# Patient Record
Sex: Female | Born: 1960 | Hispanic: No | Marital: Married | Smoking: Never smoker
Health system: Southern US, Community
[De-identification: ages and names within clinical notes are randomized; demographics above are authoritative.]

## PROBLEM LIST (undated history)

## (undated) DIAGNOSIS — E119 Type 2 diabetes mellitus without complications: Secondary | ICD-10-CM

## (undated) DIAGNOSIS — N289 Disorder of kidney and ureter, unspecified: Secondary | ICD-10-CM

## (undated) DIAGNOSIS — I10 Essential (primary) hypertension: Secondary | ICD-10-CM

---

## 2019-07-13 ENCOUNTER — Other Ambulatory Visit: Payer: Self-pay

## 2019-07-13 ENCOUNTER — Emergency Department (HOSPITAL_BASED_OUTPATIENT_CLINIC_OR_DEPARTMENT_OTHER): Payer: No Typology Code available for payment source

## 2019-07-13 ENCOUNTER — Inpatient Hospital Stay (HOSPITAL_BASED_OUTPATIENT_CLINIC_OR_DEPARTMENT_OTHER)
Admission: RE | Admit: 2019-07-13 | Discharge: 2019-07-25 | DRG: 682 | Disposition: A | Discharge status: Home Health | Payer: No Typology Code available for payment source | Attending: Internal Medicine | Admitting: Internal Medicine

## 2019-07-13 ENCOUNTER — Encounter (HOSPITAL_BASED_OUTPATIENT_CLINIC_OR_DEPARTMENT_OTHER): Payer: Self-pay | Admitting: Emergency Medicine

## 2019-07-13 DIAGNOSIS — D72829 Elevated white blood cell count, unspecified: Secondary | ICD-10-CM | POA: Diagnosis present

## 2019-07-13 DIAGNOSIS — R636 Underweight: Secondary | ICD-10-CM | POA: Diagnosis present

## 2019-07-13 DIAGNOSIS — Z20822 Contact with and (suspected) exposure to covid-19: Secondary | ICD-10-CM | POA: Diagnosis present

## 2019-07-13 DIAGNOSIS — K5900 Constipation, unspecified: Secondary | ICD-10-CM | POA: Diagnosis not present

## 2019-07-13 DIAGNOSIS — R8271 Bacteriuria: Secondary | ICD-10-CM | POA: Diagnosis not present

## 2019-07-13 DIAGNOSIS — E878 Other disorders of electrolyte and fluid balance, not elsewhere classified: Secondary | ICD-10-CM | POA: Diagnosis present

## 2019-07-13 DIAGNOSIS — R451 Restlessness and agitation: Secondary | ICD-10-CM | POA: Diagnosis not present

## 2019-07-13 DIAGNOSIS — D631 Anemia in chronic kidney disease: Secondary | ICD-10-CM | POA: Diagnosis present

## 2019-07-13 DIAGNOSIS — E875 Hyperkalemia: Secondary | ICD-10-CM | POA: Diagnosis not present

## 2019-07-13 DIAGNOSIS — I10 Essential (primary) hypertension: Secondary | ICD-10-CM | POA: Diagnosis present

## 2019-07-13 DIAGNOSIS — Z794 Long term (current) use of insulin: Secondary | ICD-10-CM

## 2019-07-13 DIAGNOSIS — Z7189 Other specified counseling: Secondary | ICD-10-CM

## 2019-07-13 DIAGNOSIS — E872 Acidosis: Secondary | ICD-10-CM | POA: Diagnosis not present

## 2019-07-13 DIAGNOSIS — D509 Iron deficiency anemia, unspecified: Secondary | ICD-10-CM | POA: Diagnosis present

## 2019-07-13 DIAGNOSIS — E871 Hypo-osmolality and hyponatremia: Secondary | ICD-10-CM | POA: Diagnosis present

## 2019-07-13 DIAGNOSIS — Z7902 Long term (current) use of antithrombotics/antiplatelets: Secondary | ICD-10-CM

## 2019-07-13 DIAGNOSIS — E1122 Type 2 diabetes mellitus with diabetic chronic kidney disease: Secondary | ICD-10-CM | POA: Diagnosis present

## 2019-07-13 DIAGNOSIS — D696 Thrombocytopenia, unspecified: Secondary | ICD-10-CM | POA: Diagnosis present

## 2019-07-13 DIAGNOSIS — Z992 Dependence on renal dialysis: Secondary | ICD-10-CM

## 2019-07-13 DIAGNOSIS — E861 Hypovolemia: Secondary | ICD-10-CM | POA: Diagnosis present

## 2019-07-13 DIAGNOSIS — N186 End stage renal disease: Secondary | ICD-10-CM | POA: Diagnosis present

## 2019-07-13 DIAGNOSIS — Z7982 Long term (current) use of aspirin: Secondary | ICD-10-CM

## 2019-07-13 DIAGNOSIS — N184 Chronic kidney disease, stage 4 (severe): Secondary | ICD-10-CM | POA: Diagnosis not present

## 2019-07-13 DIAGNOSIS — E119 Type 2 diabetes mellitus without complications: Secondary | ICD-10-CM

## 2019-07-13 DIAGNOSIS — N185 Chronic kidney disease, stage 5: Secondary | ICD-10-CM | POA: Diagnosis present

## 2019-07-13 DIAGNOSIS — B181 Chronic viral hepatitis B without delta-agent: Secondary | ICD-10-CM | POA: Diagnosis present

## 2019-07-13 DIAGNOSIS — I120 Hypertensive chronic kidney disease with stage 5 chronic kidney disease or end stage renal disease: Principal | ICD-10-CM | POA: Diagnosis present

## 2019-07-13 DIAGNOSIS — D6959 Other secondary thrombocytopenia: Secondary | ICD-10-CM | POA: Diagnosis present

## 2019-07-13 DIAGNOSIS — R54 Age-related physical debility: Secondary | ICD-10-CM | POA: Diagnosis present

## 2019-07-13 DIAGNOSIS — E11649 Type 2 diabetes mellitus with hypoglycemia without coma: Secondary | ICD-10-CM | POA: Diagnosis not present

## 2019-07-13 DIAGNOSIS — G9341 Metabolic encephalopathy: Secondary | ICD-10-CM | POA: Diagnosis present

## 2019-07-13 DIAGNOSIS — E876 Hypokalemia: Secondary | ICD-10-CM | POA: Diagnosis present

## 2019-07-13 DIAGNOSIS — Z905 Acquired absence of kidney: Secondary | ICD-10-CM

## 2019-07-13 DIAGNOSIS — Z681 Body mass index (BMI) 19 or less, adult: Secondary | ICD-10-CM

## 2019-07-13 DIAGNOSIS — M109 Gout, unspecified: Secondary | ICD-10-CM | POA: Diagnosis present

## 2019-07-13 DIAGNOSIS — Z79899 Other long term (current) drug therapy: Secondary | ICD-10-CM

## 2019-07-13 DIAGNOSIS — Z515 Encounter for palliative care: Secondary | ICD-10-CM | POA: Diagnosis not present

## 2019-07-13 HISTORY — DX: Type 2 diabetes mellitus without complications: E11.9

## 2019-07-13 HISTORY — DX: Disorder of kidney and ureter, unspecified: N28.9

## 2019-07-13 HISTORY — DX: Essential (primary) hypertension: I10

## 2019-07-13 LAB — CBC WITH DIFFERENTIAL/PLATELET
Abs Immature Granulocytes: 0.16 10*3/uL — ABNORMAL HIGH (ref 0.00–0.07)
Basophils Absolute: 0 10*3/uL (ref 0.0–0.1)
Basophils Relative: 0 %
Eosinophils Absolute: 0 10*3/uL (ref 0.0–0.5)
Eosinophils Relative: 0 %
HCT: 38.5 % (ref 36.0–46.0)
Hemoglobin: 12.5 g/dL (ref 12.0–15.0)
Immature Granulocytes: 1 %
Lymphocytes Relative: 6 %
Lymphs Abs: 0.8 10*3/uL (ref 0.7–4.0)
MCH: 19.6 pg — ABNORMAL LOW (ref 26.0–34.0)
MCHC: 32.5 g/dL (ref 30.0–36.0)
MCV: 60.4 fL — ABNORMAL LOW (ref 80.0–100.0)
Monocytes Absolute: 1.1 10*3/uL — ABNORMAL HIGH (ref 0.1–1.0)
Monocytes Relative: 8 %
Neutro Abs: 10.8 10*3/uL — ABNORMAL HIGH (ref 1.7–7.7)
Neutrophils Relative %: 85 %
Platelets: 108 10*3/uL — ABNORMAL LOW (ref 150–400)
RBC: 6.37 MIL/uL — ABNORMAL HIGH (ref 3.87–5.11)
RDW: 17.2 % — ABNORMAL HIGH (ref 11.5–15.5)
WBC: 12.9 10*3/uL — ABNORMAL HIGH (ref 4.0–10.5)
nRBC: 0.9 % — ABNORMAL HIGH (ref 0.0–0.2)

## 2019-07-13 LAB — URINALYSIS, ROUTINE W REFLEX MICROSCOPIC
Bilirubin Urine: NEGATIVE
Glucose, UA: 100 mg/dL — AB
Hgb urine dipstick: NEGATIVE
Ketones, ur: NEGATIVE mg/dL
Nitrite: NEGATIVE
Protein, ur: 100 mg/dL — AB
Specific Gravity, Urine: 1.01 (ref 1.005–1.030)
pH: 7 (ref 5.0–8.0)

## 2019-07-13 LAB — CBG MONITORING, ED: Glucose-Capillary: 96 mg/dL (ref 70–99)

## 2019-07-13 LAB — COMPREHENSIVE METABOLIC PANEL
ALT: 29 U/L (ref 0–44)
AST: 37 U/L (ref 15–41)
Albumin: 3.7 g/dL (ref 3.5–5.0)
Alkaline Phosphatase: 73 U/L (ref 38–126)
Anion gap: 15 (ref 5–15)
BUN: 51 mg/dL — ABNORMAL HIGH (ref 6–20)
CO2: 26 mmol/L (ref 22–32)
Calcium: 9.3 mg/dL (ref 8.9–10.3)
Chloride: 65 mmol/L — ABNORMAL LOW (ref 98–111)
Creatinine, Ser: 6.73 mg/dL — ABNORMAL HIGH (ref 0.44–1.00)
GFR calc Af Amer: 7 mL/min — ABNORMAL LOW (ref >60)
GFR calc non Af Amer: 6 mL/min — ABNORMAL LOW (ref >60)
Glucose, Bld: 123 mg/dL — ABNORMAL HIGH (ref 70–99)
Potassium: 3.8 mmol/L (ref 3.5–5.1)
Sodium: 106 mmol/L — CL (ref 135–145)
Total Bilirubin: 1.5 mg/dL — ABNORMAL HIGH (ref 0.3–1.2)
Total Protein: 7 g/dL (ref 6.5–8.1)

## 2019-07-13 LAB — LIPASE, BLOOD: Lipase: 47 U/L (ref 11–51)

## 2019-07-13 LAB — URINALYSIS, MICROSCOPIC (REFLEX)

## 2019-07-13 LAB — TROPONIN I (HIGH SENSITIVITY): Troponin I (High Sensitivity): 13 ng/L (ref <18)

## 2019-07-13 MED ORDER — SODIUM CHLORIDE 0.9 % IV SOLN: Freq: Once | INTRAVENOUS | Status: AC

## 2019-07-13 MED ORDER — SODIUM CHLORIDE 0.9 % IV BOLUS
500.0000 mL | Freq: Once | INTRAVENOUS | Status: AC
  Administered 2019-07-13: 500 mL via INTRAVENOUS

## 2019-07-13 NOTE — ED Notes (Signed)
Crystal Jacobs, [place] and primary RN  aware that sodium level is 106.

## 2019-07-13 NOTE — ED Notes (Signed)
ED Provider at bedside. 

## 2019-07-13 NOTE — ED Notes (Signed)
Pt on monitor 

## 2019-07-13 NOTE — ED Triage Notes (Signed)
Generalized weakness x 2 weeks. Was constipated and took a laxative, now has diarrhea and vomiting.

## 2019-07-13 NOTE — ED Notes (Signed)
Portable Xray at bedside.

## 2019-07-13 NOTE — ED Notes (Signed)
[facility] notified (Crystal Jacobs) - patient ready for transport 

## 2019-07-13 NOTE — ED Provider Notes (Signed)
[facility] EMERGENCY DEPARTMENT Provider Note   CSN: [id number] Arrival date & time: 07/13/19  1826     History Chief Complaint  Patient presents with  . Weakness    Crystal Jacobs is a 59 y.o. female.  HPI   Pt is a 59 y/o female with a h/o CKD, hypertension, diabetes who presents to the ED today for eval of generalized weakness.  Daughter at bedside assist with the history and acts as a Optometrist.  Patient declines formal translator.  Daughter states the patient was constipated about 4 to 5 days ago.  They gave her a dose of Dulcolax and the following day she had 9 bowel movements.  Since then she has been very weak. She has had normal PO intake since then. She normally does not eat much and has been c/o some vomiting. Denies seizures, but has been a little bit confused. Denies fever, URI sxs, dysuria, abd pain. Denies vomiting.  She is complaining of some chest pain but denies any shortness of breath or cough.  Patient is here visiting from [place] and got here 1 month ago.  She follows with nephrology back in [place].  She is not currently on dialysis.  She has been on a low-sodium diet.  Daughter has patient's prior labs which showed a baseline creatinine of around 7 from April.  Patient was also seen in urgent care prior to arrival and had labs completed which showed a sodium of 117 and a potassium of 3.3.  She was sent here for further evaluation.  Past Medical History:  Diagnosis Date  . Diabetes mellitus without complication ([place])   . Hypertension   . Renal disorder     Patient Active Problem List   Diagnosis Date Noted  . Acute renal failure (ARF) ([place]) 07/14/2019  . Microcytic anemia 07/14/2019  . Thrombocytopenia ([place]) 07/14/2019  . Hyponatremia 07/13/2019    History reviewed. No pertinent surgical history.   OB History   No obstetric history on file.     No family history on file.  Social History   Tobacco Use  . Smoking status: Never Smoker   . Smokeless tobacco: Never Used  Substance Use Topics  . Alcohol use: Not Currently  . Drug use: Not on file    Home Medications Prior to Admission medications   Not on File    Allergies    Patient has no known allergies.  Review of Systems   Review of Systems  Constitutional: Negative for fever.  HENT: Negative for ear pain and sore throat.   Eyes: Negative for visual disturbance.  Respiratory: Negative for cough and shortness of breath.   Cardiovascular: Negative for chest pain.  Gastrointestinal: Positive for constipation, diarrhea and nausea. Negative for abdominal pain and vomiting.  Genitourinary: Negative for dysuria and hematuria.  Musculoskeletal: Negative for back pain.  Skin: Negative for rash.  Neurological: Positive for weakness. Negative for seizures and syncope.  All other systems reviewed and are negative.   Physical Exam Updated Vital Signs BP (!) 156/60   Pulse 64   Temp 97.7 F (36.5 C) (Oral)   Resp 11   Ht 5\' 2"  (1.575 m)   Wt 43.1 kg   SpO2 100%   BMI 17.38 kg/m   Physical Exam Vitals and nursing note reviewed.  Constitutional:      General: She is not in acute distress.    Appearance: She is well-developed.     Comments: Frail, chronically ill appearing woman  HENT:  Head: Normocephalic and atraumatic.     Mouth/Throat:     Mouth: Mucous membranes are dry.  Eyes:     Conjunctiva/sclera: Conjunctivae normal.  Cardiovascular:     Rate and Rhythm: Normal rate and regular rhythm.     Heart sounds: Normal heart sounds. No murmur heard.   Pulmonary:     Effort: Pulmonary effort is normal. No respiratory distress.     Breath sounds: Normal breath sounds. No wheezing, rhonchi or rales.  Abdominal:     General: Bowel sounds are normal.     Palpations: Abdomen is soft.     Tenderness: There is no abdominal tenderness. There is no guarding or rebound.  Musculoskeletal:     Cervical back: Neck supple.     Comments: ttp to the right  mid back  Skin:    General: Skin is warm and dry.  Neurological:     Mental Status: She is alert.     ED Results / Procedures / Treatments   Labs (all labs ordered are listed, but only abnormal results are displayed) Labs Reviewed  CBC WITH DIFFERENTIAL/PLATELET - Abnormal; Notable for the following components:      Result Value   WBC 12.9 (*)    RBC 6.37 (*)    MCV 60.4 (*)    MCH 19.6 (*)    RDW 17.2 (*)    Platelets 108 (*)    nRBC 0.9 (*)    Neutro Abs 10.8 (*)    Monocytes Absolute 1.1 (*)    Abs Immature Granulocytes 0.16 (*)    All other components within normal limits  URINALYSIS, ROUTINE W REFLEX MICROSCOPIC - Abnormal; Notable for the following components:   Glucose, UA 100 (*)    Protein, ur 100 (*)    Leukocytes,Ua TRACE (*)    All other components within normal limits  COMPREHENSIVE METABOLIC PANEL - Abnormal; Notable for the following components:   Sodium 106 (*)    Chloride 65 (*)    Glucose, Bld 123 (*)    BUN 51 (*)    Creatinine, Ser 6.73 (*)    Total Bilirubin 1.5 (*)    GFR calc non Af Amer 6 (*)    GFR calc Af Amer 7 (*)    All other components within normal limits  URINALYSIS, MICROSCOPIC (REFLEX) - Abnormal; Notable for the following components:   Bacteria, UA MANY (*)    Non Squamous Epithelial PRESENT (*)    All other components within normal limits  SARS CORONAVIRUS 2 BY RT PCR (HOSPITAL ORDER, [facility])  MRSA PCR SCREENING  LIPASE, BLOOD  SODIUM, URINE, RANDOM  OSMOLALITY, URINE  OSMOLALITY  TSH  BASIC METABOLIC PANEL  BASIC METABOLIC PANEL  BASIC METABOLIC PANEL  BASIC METABOLIC PANEL  CBG MONITORING, ED  TROPONIN I (HIGH SENSITIVITY)  TROPONIN I (HIGH SENSITIVITY)    EKG EKG Interpretation  Date/Time:  Saturday July 13 2019 20:55:32 EDT Ventricular Rate:  68 PR Interval:    QRS Duration: 88 QT Interval:  407 QTC Calculation: 433 R Axis:   42 Text Interpretation: Sinus rhythm Borderline  repolarization abnormality Baseline wander in lead(s) V6 No old tracing to compare Confirmed by Aletta Edouard [phone]) on 07/13/2019 9:06:18 PM   Radiology DG Chest Portable 1 View  Result Date: 07/13/2019 CLINICAL DATA:  59 year old female with chest pain. EXAM: PORTABLE CHEST 1 VIEW COMPARISON:  None. FINDINGS: The heart size and mediastinal contours are within normal limits. Both lungs are  clear. The visualized skeletal structures are unremarkable. IMPRESSION: No active disease. Electronically Signed   By: Anner Crete M.D.   On: 07/13/2019 20:38    Procedures Procedures (including critical care time)  CRITICAL CARE Performed by: Rodney Booze   Total critical care time: 40 minutes  Critical care time was exclusive of separately billable procedures and treating other patients.  Critical care was necessary to treat or prevent imminent or life-threatening deterioration.  Critical care was time spent personally by me on the following activities: development of treatment plan with patient and/or surrogate as well as nursing, discussions with consultants, evaluation of patient's response to treatment, examination of patient, obtaining history from patient or surrogate, ordering and performing treatments and interventions, ordering and review of laboratory studies, ordering and review of radiographic studies, pulse oximetry and re-evaluation of patient's condition.   Medications Ordered in ED Medications  sodium chloride 0.9 % bolus 500 mL (500 mLs Intravenous New Bag/Given 07/13/19 2319)  0.9 %  sodium chloride infusion ( Intravenous New Bag/Given 07/13/19 2318)    ED Course  I have reviewed the triage vital signs and the nursing notes.  Pertinent labs & imaging results that were available during my care of the patient were reviewed by me and considered in my medical decision making (see chart for details).  Clinical Course as of Jul 14 43  Sat Jul 13, 1826  6171  59 year old female visiting here with increased weakness and recent constipation diarrhea with laxative.  Seen at urgent care and found to have a low sodium.  Chronic CKD not on dialysis.  Getting repeat labs.  Disposition per results of testing.   [MB]    Clinical Course User Index [MB] Hayden Rasmussen, MD   MDM Rules/Calculators/A&P                          59 year old female with history of CKD presenting with generalized weakness after having multiple bowel movements after laxative earlier this week.  Also had labs at urgent care prior to arrival with sodium of 117.  CBC with leukocytosis at 12.9, thrombocytopenia present.  No anemia. CMP with sodium of 106, chloride low, elevated BUN/creatinine which appears to be around her baseline.  Bilirubin slightly elevated 1.5. Lipase negative  troponin negative Covid negative UA with some leuks but otherwise no findings to suggest UTI.  Patient with significant hyponatremia, will require admission.  Suspect that volume depletion is likely contributing given multiple episodes of diarrhea earlier this week.  Will consult hospitalist service for admission.  10:12 PM CONSULT with Dr. Claria Dice who accepts patient for admission. Advises to order urine osmolality, serum osmolality and urine sodium. Advises to consult nephrology.   10:17 PM CONSULT with Dr. Jonnie Finner with nephrology who recommended giving the patient a 500 cc bolus of fluids.  Recommended starting maintenance fluids at 75 cc/h and checking sodium every 3 hours.  If sodium is uptrending then continue treatment however if sodium is downtrending then reconsult nephrology for further recommendations.  Patient transferred [facility] in stable condition.   Final Clinical Impression(s) / ED Diagnoses Final diagnoses:  Hyponatremia    Rx / DC Orders ED Discharge Orders    None       Rodney Booze, PA-C 07/14/19 0045    Hayden Rasmussen, MD 07/14/19 1114

## 2019-07-13 NOTE — ED Notes (Signed)
EKG done by someone else, clicked off order

## 2019-07-14 ENCOUNTER — Encounter (HOSPITAL_COMMUNITY): Payer: Self-pay | Admitting: Internal Medicine

## 2019-07-14 DIAGNOSIS — Z681 Body mass index (BMI) 19 or less, adult: Secondary | ICD-10-CM | POA: Diagnosis not present

## 2019-07-14 DIAGNOSIS — E876 Hypokalemia: Secondary | ICD-10-CM | POA: Diagnosis present

## 2019-07-14 DIAGNOSIS — E871 Hypo-osmolality and hyponatremia: Secondary | ICD-10-CM | POA: Diagnosis present

## 2019-07-14 DIAGNOSIS — D6959 Other secondary thrombocytopenia: Secondary | ICD-10-CM | POA: Diagnosis present

## 2019-07-14 DIAGNOSIS — Z7189 Other specified counseling: Secondary | ICD-10-CM | POA: Diagnosis not present

## 2019-07-14 DIAGNOSIS — D631 Anemia in chronic kidney disease: Secondary | ICD-10-CM | POA: Diagnosis present

## 2019-07-14 DIAGNOSIS — Z20822 Contact with and (suspected) exposure to covid-19: Secondary | ICD-10-CM | POA: Diagnosis present

## 2019-07-14 DIAGNOSIS — N184 Chronic kidney disease, stage 4 (severe): Secondary | ICD-10-CM | POA: Diagnosis present

## 2019-07-14 DIAGNOSIS — E119 Type 2 diabetes mellitus without complications: Secondary | ICD-10-CM | POA: Diagnosis not present

## 2019-07-14 DIAGNOSIS — D509 Iron deficiency anemia, unspecified: Secondary | ICD-10-CM | POA: Diagnosis present

## 2019-07-14 DIAGNOSIS — B181 Chronic viral hepatitis B without delta-agent: Secondary | ICD-10-CM | POA: Diagnosis present

## 2019-07-14 DIAGNOSIS — E878 Other disorders of electrolyte and fluid balance, not elsewhere classified: Secondary | ICD-10-CM | POA: Diagnosis present

## 2019-07-14 DIAGNOSIS — G9341 Metabolic encephalopathy: Secondary | ICD-10-CM | POA: Diagnosis present

## 2019-07-14 DIAGNOSIS — E872 Acidosis: Secondary | ICD-10-CM | POA: Diagnosis not present

## 2019-07-14 DIAGNOSIS — I120 Hypertensive chronic kidney disease with stage 5 chronic kidney disease or end stage renal disease: Secondary | ICD-10-CM | POA: Diagnosis present

## 2019-07-14 DIAGNOSIS — E11649 Type 2 diabetes mellitus with hypoglycemia without coma: Secondary | ICD-10-CM | POA: Diagnosis not present

## 2019-07-14 DIAGNOSIS — D696 Thrombocytopenia, unspecified: Secondary | ICD-10-CM | POA: Diagnosis present

## 2019-07-14 DIAGNOSIS — Z515 Encounter for palliative care: Secondary | ICD-10-CM | POA: Diagnosis not present

## 2019-07-14 DIAGNOSIS — R54 Age-related physical debility: Secondary | ICD-10-CM | POA: Diagnosis present

## 2019-07-14 DIAGNOSIS — N185 Chronic kidney disease, stage 5: Secondary | ICD-10-CM

## 2019-07-14 DIAGNOSIS — R636 Underweight: Secondary | ICD-10-CM | POA: Diagnosis present

## 2019-07-14 DIAGNOSIS — I10 Essential (primary) hypertension: Secondary | ICD-10-CM | POA: Diagnosis not present

## 2019-07-14 DIAGNOSIS — E1122 Type 2 diabetes mellitus with diabetic chronic kidney disease: Secondary | ICD-10-CM | POA: Diagnosis present

## 2019-07-14 DIAGNOSIS — Z794 Long term (current) use of insulin: Secondary | ICD-10-CM

## 2019-07-14 DIAGNOSIS — N186 End stage renal disease: Secondary | ICD-10-CM | POA: Diagnosis present

## 2019-07-14 LAB — CBC
HCT: 37 % (ref 36.0–46.0)
Hemoglobin: 12.2 g/dL (ref 12.0–15.0)
MCH: 19.7 pg — ABNORMAL LOW (ref 26.0–34.0)
MCHC: 33 g/dL (ref 30.0–36.0)
MCV: 59.7 fL — ABNORMAL LOW (ref 80.0–100.0)
Platelets: 95 10*3/uL — ABNORMAL LOW (ref 150–400)
RBC: 6.2 MIL/uL — ABNORMAL HIGH (ref 3.87–5.11)
RDW: 15.9 % — ABNORMAL HIGH (ref 11.5–15.5)
WBC: 12 10*3/uL — ABNORMAL HIGH (ref 4.0–10.5)
nRBC: 0.6 % — ABNORMAL HIGH (ref 0.0–0.2)

## 2019-07-14 LAB — OSMOLALITY: Osmolality: 239 mOsm/kg — CL (ref 275–295)

## 2019-07-14 LAB — VITAMIN B12: Vitamin B-12: 3305 pg/mL — ABNORMAL HIGH (ref 180–914)

## 2019-07-14 LAB — HEMOGLOBIN A1C
Hgb A1c MFr Bld: 6.2 % — ABNORMAL HIGH (ref 4.8–5.6)
Mean Plasma Glucose: 131.24 mg/dL

## 2019-07-14 LAB — RENAL FUNCTION PANEL
Albumin: 2.6 g/dL — ABNORMAL LOW (ref 3.5–5.0)
Albumin: 2.4 g/dL — ABNORMAL LOW (ref 3.5–5.0)
Anion gap: 13 (ref 5–15)
Anion gap: 11 (ref 5–15)
BUN: 46 mg/dL — ABNORMAL HIGH (ref 6–20)
BUN: 42 mg/dL — ABNORMAL HIGH (ref 6–20)
CO2: 23 mmol/L (ref 22–32)
CO2: 19 mmol/L — ABNORMAL LOW (ref 22–32)
Calcium: 8.4 mg/dL — ABNORMAL LOW (ref 8.9–10.3)
Calcium: 7.4 mg/dL — ABNORMAL LOW (ref 8.9–10.3)
Chloride: 75 mmol/L — ABNORMAL LOW (ref 98–111)
Chloride: 87 mmol/L — ABNORMAL LOW (ref 98–111)
Creatinine, Ser: 6.52 mg/dL — ABNORMAL HIGH (ref 0.44–1.00)
Creatinine, Ser: 5.49 mg/dL — ABNORMAL HIGH (ref 0.44–1.00)
GFR calc Af Amer: 8 mL/min — ABNORMAL LOW (ref >60)
GFR calc Af Amer: 9 mL/min — ABNORMAL LOW (ref >60)
GFR calc non Af Amer: 7 mL/min — ABNORMAL LOW (ref >60)
GFR calc non Af Amer: 8 mL/min — ABNORMAL LOW (ref >60)
Glucose, Bld: 80 mg/dL (ref 70–99)
Glucose, Bld: 224 mg/dL — ABNORMAL HIGH (ref 70–99)
Phosphorus: 4.5 mg/dL (ref 2.5–4.6)
Phosphorus: 3.3 mg/dL (ref 2.5–4.6)
Potassium: 3.6 mmol/L (ref 3.5–5.1)
Potassium: 3.1 mmol/L — ABNORMAL LOW (ref 3.5–5.1)
Sodium: 111 mmol/L — CL (ref 135–145)
Sodium: 117 mmol/L — CL (ref 135–145)

## 2019-07-14 LAB — BASIC METABOLIC PANEL
Anion gap: 18 — ABNORMAL HIGH (ref 5–15)
BUN: 48 mg/dL — ABNORMAL HIGH (ref 6–20)
CO2: 21 mmol/L — ABNORMAL LOW (ref 22–32)
Calcium: 9.3 mg/dL (ref 8.9–10.3)
Chloride: 70 mmol/L — ABNORMAL LOW (ref 98–111)
Creatinine, Ser: 6.57 mg/dL — ABNORMAL HIGH (ref 0.44–1.00)
GFR calc Af Amer: 7 mL/min — ABNORMAL LOW (ref >60)
GFR calc non Af Amer: 6 mL/min — ABNORMAL LOW (ref >60)
Glucose, Bld: 78 mg/dL (ref 70–99)
Potassium: 3.8 mmol/L (ref 3.5–5.1)
Sodium: 109 mmol/L — CL (ref 135–145)

## 2019-07-14 LAB — FOLATE: Folate: >100 ng/mL (ref >5.9)

## 2019-07-14 LAB — GLUCOSE, CAPILLARY
Glucose-Capillary: 71 mg/dL (ref 70–99)
Glucose-Capillary: 169 mg/dL — ABNORMAL HIGH (ref 70–99)
Glucose-Capillary: 203 mg/dL — ABNORMAL HIGH (ref 70–99)
Glucose-Capillary: 191 mg/dL — ABNORMAL HIGH (ref 70–99)
Glucose-Capillary: 108 mg/dL — ABNORMAL HIGH (ref 70–99)

## 2019-07-14 LAB — SARS CORONAVIRUS 2 BY RT PCR (HOSPITAL ORDER, PERFORMED IN ~~LOC~~ HOSPITAL LAB): SARS Coronavirus 2: NEGATIVE

## 2019-07-14 LAB — SODIUM: Sodium: 113 mmol/L — CL (ref 135–145)

## 2019-07-14 LAB — FERRITIN: Ferritin: 1772 ng/mL — ABNORMAL HIGH (ref 11–307)

## 2019-07-14 LAB — TSH: TSH: 1.467 u[IU]/mL (ref 0.350–4.500)

## 2019-07-14 LAB — IRON AND TIBC
Iron: 97 ug/dL (ref 28–170)
Saturation Ratios: 65 % — ABNORMAL HIGH (ref 10.4–31.8)
TIBC: 150 ug/dL — ABNORMAL LOW (ref 250–450)
UIBC: 53 ug/dL

## 2019-07-14 LAB — HIV ANTIBODY (ROUTINE TESTING W REFLEX): HIV Screen 4th Generation wRfx: NONREACTIVE

## 2019-07-14 LAB — SODIUM, URINE, RANDOM: Sodium, Ur: 11 mmol/L

## 2019-07-14 LAB — OSMOLALITY, URINE: Osmolality, Ur: 173 mOsm/kg — ABNORMAL LOW (ref 300–900)

## 2019-07-14 LAB — TROPONIN I (HIGH SENSITIVITY): Troponin I (High Sensitivity): 10 ng/L (ref <18)

## 2019-07-14 LAB — MRSA PCR SCREENING: MRSA by PCR: NEGATIVE

## 2019-07-14 MED ORDER — ONDANSETRON HCL 4 MG/2ML IJ SOLN: 4.0000 mg | Freq: Four times a day (QID) | INTRAMUSCULAR | Status: DC | PRN

## 2019-07-14 MED ORDER — ACETAMINOPHEN 325 MG PO TABS: 650.0000 mg | ORAL_TABLET | Freq: Four times a day (QID) | ORAL | Status: DC | PRN

## 2019-07-14 MED ORDER — SENNOSIDES-DOCUSATE SODIUM 8.6-50 MG PO TABS: 1.0000 | ORAL_TABLET | Freq: Every evening | ORAL | Status: DC | PRN

## 2019-07-14 MED ORDER — DEXTROSE 5 % IV SOLN: INTRAVENOUS | Status: DC

## 2019-07-14 MED ORDER — VITAMIN D 25 MCG (1000 UNIT) PO TABS
1000.0000 [IU] | ORAL_TABLET | Freq: Every day | ORAL | Status: DC
  Administered 2019-07-14 – 2019-07-25 (×10): 1000 [IU] via ORAL
  Filled 2019-07-14 (×11): qty 1

## 2019-07-14 MED ORDER — SODIUM CHLORIDE 0.9 % IV SOLN: INTRAVENOUS | Status: DC

## 2019-07-14 MED ORDER — CLOPIDOGREL BISULFATE 75 MG PO TABS
75.0000 mg | ORAL_TABLET | Freq: Every day | ORAL | Status: DC
  Administered 2019-07-14 – 2019-07-25 (×12): 75 mg via ORAL
  Filled 2019-07-14 (×12): qty 1

## 2019-07-14 MED ORDER — INSULIN ASPART 100 UNIT/ML ~~LOC~~ SOLN
2.0000 [IU] | Freq: Three times a day (TID) | SUBCUTANEOUS | Status: DC
  Administered 2019-07-14 – 2019-07-23 (×13): 2 [IU] via SUBCUTANEOUS

## 2019-07-14 MED ORDER — ASPIRIN EC 81 MG PO TBEC
81.0000 mg | DELAYED_RELEASE_TABLET | Freq: Every day | ORAL | Status: DC
  Administered 2019-07-14 – 2019-07-25 (×11): 81 mg via ORAL
  Filled 2019-07-14 (×13): qty 1

## 2019-07-14 MED ORDER — INSULIN ASPART 100 UNIT/ML ~~LOC~~ SOLN: 0.0000 [IU] | Freq: Three times a day (TID) | SUBCUTANEOUS | Status: DC

## 2019-07-14 MED ORDER — HEPARIN SODIUM (PORCINE) 5000 UNIT/ML IJ SOLN
5000.0000 [IU] | Freq: Three times a day (TID) | INTRAMUSCULAR | Status: DC
  Administered 2019-07-14 – 2019-07-16 (×6): 5000 [IU] via SUBCUTANEOUS
  Filled 2019-07-14 (×8): qty 1

## 2019-07-14 MED ORDER — INSULIN DETEMIR 100 UNIT/ML ~~LOC~~ SOLN
2.0000 [IU] | Freq: Every day | SUBCUTANEOUS | Status: DC
  Administered 2019-07-14 – 2019-07-20 (×6): 2 [IU] via SUBCUTANEOUS
  Filled 2019-07-14 (×8): qty 0.02

## 2019-07-14 MED ORDER — SODIUM BICARBONATE 650 MG PO TABS
650.0000 mg | ORAL_TABLET | Freq: Two times a day (BID) | ORAL | Status: DC
  Administered 2019-07-14: 650 mg via ORAL
  Filled 2019-07-14: qty 1

## 2019-07-14 MED ORDER — INSULIN ASPART 100 UNIT/ML ~~LOC~~ SOLN
0.0000 [IU] | Freq: Three times a day (TID) | SUBCUTANEOUS | Status: DC
  Administered 2019-07-14: 2 [IU] via SUBCUTANEOUS
  Administered 2019-07-14 – 2019-07-21 (×7): 1 [IU] via SUBCUTANEOUS
  Administered 2019-07-21: 4 [IU] via SUBCUTANEOUS
  Administered 2019-07-22 (×2): 1 [IU] via SUBCUTANEOUS
  Administered 2019-07-23: 4 [IU] via SUBCUTANEOUS

## 2019-07-14 MED ORDER — DEXTROSE 5 % IV SOLN: INTRAVENOUS | Status: AC

## 2019-07-14 MED ORDER — ACETAMINOPHEN 650 MG RE SUPP
650.0000 mg | Freq: Four times a day (QID) | RECTAL | Status: DC | PRN
  Filled 2019-07-14: qty 1

## 2019-07-14 MED ORDER — FEBUXOSTAT 40 MG PO TABS
40.0000 mg | ORAL_TABLET | Freq: Every day | ORAL | Status: DC
  Administered 2019-07-14 – 2019-07-25 (×10): 40 mg via ORAL
  Filled 2019-07-14 (×12): qty 1

## 2019-07-14 MED ORDER — SODIUM CHLORIDE 0.9% FLUSH
3.0000 mL | Freq: Two times a day (BID) | INTRAVENOUS | Status: DC
  Administered 2019-07-14 – 2019-07-23 (×10): 3 mL via INTRAVENOUS

## 2019-07-14 MED ORDER — ONDANSETRON HCL 4 MG PO TABS: 4.0000 mg | ORAL_TABLET | Freq: Four times a day (QID) | ORAL | Status: DC | PRN

## 2019-07-14 MED ORDER — POTASSIUM CHLORIDE CRYS ER 20 MEQ PO TBCR
20.0000 meq | EXTENDED_RELEASE_TABLET | Freq: Two times a day (BID) | ORAL | Status: DC
  Administered 2019-07-14 – 2019-07-15 (×3): 20 meq via ORAL
  Filled 2019-07-14 (×3): qty 1

## 2019-07-14 MED ORDER — CLOPIDOGREL BISULFATE 75 MG PO TABS: 75.0000 mg | ORAL_TABLET | Freq: Every day | ORAL | Status: DC

## 2019-07-14 NOTE — Progress Notes (Signed)
PT Cancellation Note  Patient Details Name: Keyshia Orwick MRN: [id number] DOB: 27-Apr-1960   Cancelled Treatment:    Reason Eval/Treat Not Completed: Medical issues which prohibited therapy. Pt unable to tolerate vertical due to orthostatic hypotension. Will try again tomorrow.    Shary Decamp [facility] 07/14/2019, 2:35 PM Brix Brearley [place] Pager [phone] Office [phone]

## 2019-07-14 NOTE — Progress Notes (Signed)
Pt with T=95.5 rectally. Dr Claria Dice informed. Bair Hugger warming blanket applied. Rechecked T=97.5. Will continue to monitor pt.

## 2019-07-14 NOTE — Progress Notes (Addendum)
CRITICAL VALUE ALERT  Critical Value:  Na++ 117   Date & Time Notied:  07/14/2019 at 1630  Provider Notified: Charlynne Cousins, MD   Orders Received/Actions taken: known critical lab value, continue to monitor Pt's lab value - see orders

## 2019-07-14 NOTE — Progress Notes (Signed)
Could not do the 3 minute stand, Pt could not tolerate the standing b/p, said it became black during the middle of the b/p and had to sit back down.

## 2019-07-14 NOTE — Plan of Care (Signed)
°  Problem: Education: Goal: Knowledge of General Education information will improve Description: Including pain rating scale, medication(s)/side effects and non-pharmacologic comfort measures 07/14/2019 1039 by Shanon Ace, RN Outcome: Progressing 07/14/2019 1038 by Shanon Ace, RN Outcome: Progressing   Problem: Clinical Measurements: Goal: Ability to maintain clinical measurements within normal limits will improve 07/14/2019 1039 by Shanon Ace, RN Outcome: Progressing 07/14/2019 1038 by Shanon Ace, RN Outcome: Progressing Goal: Will remain free from infection 07/14/2019 1039 by Shanon Ace, RN Outcome: Progressing 07/14/2019 1038 by Shanon Ace, RN Outcome: Progressing Goal: Diagnostic test results will improve 07/14/2019 1039 by Shanon Ace, RN Outcome: Progressing 07/14/2019 1038 by Shanon Ace, RN Outcome: Progressing Goal: Respiratory complications will improve 07/14/2019 1039 by Shanon Ace, RN Outcome: Progressing 07/14/2019 1038 by Shanon Ace, RN Outcome: Progressing Goal: Cardiovascular complication will be avoided 07/14/2019 1039 by Shanon Ace, RN Outcome: Progressing 07/14/2019 1038 by Shanon Ace, RN Outcome: Progressing   Problem: Activity: Goal: Risk for activity intolerance will decrease 07/14/2019 1039 by Shanon Ace, RN Outcome: Progressing 07/14/2019 1038 by Shanon Ace, RN Outcome: Progressing   Problem: Nutrition: Goal: Adequate nutrition will be maintained 07/14/2019 1039 by Shanon Ace, RN Outcome: Progressing 07/14/2019 1038 by Shanon Ace, RN Outcome: Progressing   Problem: Coping: Goal: Level of anxiety will decrease 07/14/2019 1039 by Shanon Ace, RN Outcome: Progressing 07/14/2019 1038 by Shanon Ace, RN Outcome: Progressing   Problem: Elimination: Goal: Will not experience complications related to bowel motility 07/14/2019 1039 by Shanon Ace, RN Outcome: Progressing 07/14/2019 1038 by Shanon Ace,  RN Outcome: Progressing Goal: Will not experience complications related to urinary retention 07/14/2019 1039 by Shanon Ace, RN Outcome: Progressing 07/14/2019 1038 by Shanon Ace, RN Outcome: Progressing   Problem: Safety: Goal: Ability to remain free from injury will improve 07/14/2019 1039 by Shanon Ace, RN Outcome: Progressing 07/14/2019 1038 by Shanon Ace, RN Outcome: Progressing   Problem: Pain Managment: Goal: General experience of comfort will improve 07/14/2019 1039 by Shanon Ace, RN Outcome: Progressing 07/14/2019 1038 by Shanon Ace, RN Outcome: Progressing   Problem: Skin Integrity: Goal: Risk for impaired skin integrity will decrease 07/14/2019 1039 by Shanon Ace, RN Outcome: Progressing 07/14/2019 1038 by Shanon Ace, RN Outcome: Progressing

## 2019-07-14 NOTE — H&P (Signed)
History and Physical    Crystal Jacobs JIR:[id number] DOB: 07-07-1960 DOA: 07/13/2019  PCP: Patient, No Pcp Per  Patient coming from: [facility] ED  I have personally briefly reviewed patient's old medical records in [place]  Chief Complaint: Generalized weakness  HPI: Crystal Jacobs is a 59 y.o. female with reported medical history significant for insulin-dependent type 2 diabetes, hypertension, CKD stage V, reported history of nephrectomy who presented to [facility] ED for evaluation of generalized weakness.  History is supplemented by daughter at bedside.  About 2 weeks ago patient began to feel generally weak.  She had been having constipation and about 1 week ago she took Dulcolax.  The following day she had 9 bowel movements which have reportedly normalized since then.  She has however been feeling very weak and unable to perform the usual amount of activity she is used to.  She says she has had poor appetite and low oral intake.  She has had some nausea and vomiting.  She says she has a history of CKD stage V weight loss reported creatinine 7.7 in April 2021.  She says she is makes a small amount of urine without dysuria.  She says she usually gets injections to keep her blood counts up.  She denies any obvious bleeding.  She says she now again has decreased bowel movements than expected.  She is visiting from [place] and arrived 1 month ago.  She has been on a low-sodium diet.  She has a medication list which reads as follows:  Lantus 8 units once a day Novorapid 6 units daily Pantoprazole Multivitamin Minipress XL 5 mg twice daily Sodium bicarb 500 twice daily Febuxostat40 mg daily for gout Clopitab (Plavix/ASA 75/10) daily Cilnidipine 10/50 9 am and 9 pm   [facility] ED Course:  Initial vitals showed BP 124/50, pulse 60, RR 16, temp 97.7 Fahrenheit, SPO2 100% on room air.  Labs are significant for sodium 106, potassium 3.8, chloride  65, bicarb 26, BUN 51, creatinine 6.73, serum glucose 123, total bilirubin 1.5, AST 37, ALT 29, alk phos 73, WBC 12.9, hemoglobin 12.5, platelets 108,000, high-sensitivity troponin I 13 >> 10 lipase 47.  Urinalysis showed negative ketones, 100 protein, negative nitrites, trace leukocytes, 0-5 RBC/hpf, 0-5 WBC/hpf, many bacteria microscopy.  Urine sodium, urine osmolality, serum osmolality are ordered and pending.  TSH ordered and pending.  SARS-CoV-2 PCR is negative.  Portable chest x-ray was negative for focal consolidation, edema, or effusion.  EDP discussed the case with on-call nephrology who recommended 500 cc normal saline bolus followed by maintenance infusion at 75 mL/hour.  The hospitalist service was consulted to admit for further evaluation and management.  Review of Systems: All systems reviewed and are negative except as documented in history of present illness above.   Past Medical History:  Diagnosis Date  . Diabetes mellitus without complication ([place])   . Hypertension   . Renal disorder     History reviewed. No pertinent surgical history.  Social History:  reports that she has never smoked. She has never used smokeless tobacco. She reports previous alcohol use. No history on file for drug use.  No Known Allergies  No family history on file.   Prior to Admission medications   Not on File    Physical Exam: Vitals:   07/13/19 2105 07/13/19 2228 07/14/19 0047 07/14/19 0100  BP: (!) 161/60 (!) 156/60 (!) 156/59   Pulse: 65 64 69 70  Resp:  '12 11 16 15  ' Temp:      TempSrc:      SpO2: 100% 100% 99% 100%  Weight:   43.8 kg   Height:       Constitutional: Resting supine in bed, NAD, calm, comfortable Eyes: PERRL, lids and conjunctivae normal ENMT: Mucous membranes are moist. Posterior pharynx clear of any exudate or lesions.Normal dentition.  Neck: normal, supple, no masses. Respiratory: clear to auscultation anteriorly. Normal respiratory effort. No accessory  muscle use.  Cardiovascular: Regular rate and rhythm, no murmurs / rubs / gallops. No extremity edema. 2+ pedal pulses.  LUE antecubital fistula with palpable thrill. Abdomen: no tenderness, no masses palpated. No hepatosplenomegaly. Bowel sounds positive.  Musculoskeletal: no clubbing / cyanosis. No joint deformity upper and lower extremities. Good ROM, no contractures. Normal muscle tone.  Skin: no rashes, lesions, ulcers. No induration Neurologic: CN 2-12 grossly intact. Sensation intact, Strength 5/5 in all 4.  Psychiatric: Normal judgment and insight. Alert and oriented x 3. Normal mood.   Labs on Admission: I have personally reviewed following labs and imaging studies  CBC: Recent Labs  Lab 07/13/19 2101  WBC 12.9*  NEUTROABS 10.8*  HGB 12.5  HCT 38.5  MCV 60.4*  PLT 185*   Basic Metabolic Panel: Recent Labs  Lab 07/13/19 2101  NA 106*  K 3.8  CL 65*  CO2 26  GLUCOSE 123*  BUN 51*  CREATININE 6.73*  CALCIUM 9.3   GFR: Estimated Creatinine Clearance: 6.5 mL/min (A) (by C-G formula based on SCr of 6.73 mg/dL (H)). Liver Function Tests: Recent Labs  Lab 07/13/19 2101  AST 37  ALT 29  ALKPHOS 73  BILITOT 1.5*  PROT 7.0  ALBUMIN 3.7   Recent Labs  Lab 07/13/19 2101  LIPASE 47   No results for input(s): AMMONIA in the last 168 hours. Coagulation Profile: No results for input(s): INR, PROTIME in the last 168 hours. Cardiac Enzymes: No results for input(s): CKTOTAL, CKMB, CKMBINDEX, TROPONINI in the last 168 hours. BNP (last 3 results) No results for input(s): PROBNP in the last 8760 hours. HbA1C: No results for input(s): HGBA1C in the last 72 hours. CBG: Recent Labs  Lab 07/13/19 2239  GLUCAP 96   Lipid Profile: No results for input(s): CHOL, HDL, LDLCALC, TRIG, CHOLHDL, LDLDIRECT in the last 72 hours. Thyroid Function Tests: No results for input(s): TSH, T4TOTAL, FREET4, T3FREE, THYROIDAB in the last 72 hours. Anemia Panel: No results for  input(s): VITAMINB12, FOLATE, FERRITIN, TIBC, IRON, RETICCTPCT in the last 72 hours. Urine analysis:    Component Value Date/Time   COLORURINE YELLOW 07/13/2019 2308   APPEARANCEUR CLEAR 07/13/2019 2308   LABSPEC 1.010 07/13/2019 2308   PHURINE 7.0 07/13/2019 2308   GLUCOSEU 100 (A) 07/13/2019 2308   HGBUR NEGATIVE 07/13/2019 2308   BILIRUBINUR NEGATIVE 07/13/2019 2308   KETONESUR NEGATIVE 07/13/2019 2308   PROTEINUR 100 (A) 07/13/2019 2308   NITRITE NEGATIVE 07/13/2019 2308   LEUKOCYTESUR TRACE (A) 07/13/2019 2308    Radiological Exams on Admission: DG Chest Portable 1 View  Result Date: 07/13/2019 CLINICAL DATA:  59 year old female with chest pain. EXAM: PORTABLE CHEST 1 VIEW COMPARISON:  None. FINDINGS: The heart size and mediastinal contours are within normal limits. Both lungs are clear. The visualized skeletal structures are unremarkable. IMPRESSION: No active disease. Electronically Signed   By: Anner Crete M.D.   On: 07/13/2019 20:38    EKG: Independently reviewed. Normal sinus rhythm, wandering leads, no prior for comparison.  Assessment/Plan Principal  Problem:   Hyponatremia Active Problems:   CKD (chronic kidney disease), stage V (HCC)   Microcytic anemia   Thrombocytopenia (HCC)   Insulin dependent type 2 diabetes mellitus (H[facility])  Crystal Jacobs is a 59 y.o. 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  Severe hyponatremia: Sodium 106 on ED arrival.  Has been given 500 cc normal saline and placed on maintenance fluids at 75 mL/hour. -Continue IV NS 75 mL/hr for now -Stat BMP now and q3h overnight, adjust fluids depending on sodium trend -Goal sodium over next 24 hours is 112-114 -Nephrology consulted, further assistance appreciated -Follow serum osmolality, urine osmolality, urine sodium, TSH  CKD stage  V: Patient reports chronic known CKD stage V for which she has followed with her nephrologist in [place].  She has a left upper extremity fistula in place but has not required initiation of dialysis yet.  She reports making small amount of urine still.  She says her last known creatinine was 7.7 in April 2021. -Continue to monitor, nephrology assistance appreciated  Thrombocytopenia: Platelets 100,000 without obvious bleeding.  Is on combination Plavix/aspirin at home, not clear reason for antiplatelet therapy which will need to clarify.  Will hold tonight.  Microcytic anemia: She states she has been receiving what sounds like erythropoietin injections by her physicians.  Hemoglobin currently stable at 12.5.  Will check anemia labs.  Insulin-dependent type 2 diabetes: Place on sensitive SSI while in hospital and continue to monitor.  Hypertension: Blood pressure relatively stable.  Home medication lists Minipress and Cilnidipine (calcium channel blocker used in [place]).  Will hold both for now.  Generalized weakness: Request PT eval.  DVT prophylaxis: Subcutaneous heparin Code Status: Full code, confirmed with patient Family Communication: Discussed with patient's daughter at bedside Disposition Plan: From home and likely discharge to home pending adequate sodium correction Consults called: Nephrology Admission status:  Status is: Inpatient  Remains inpatient appropriate because:Persistent severe electrolyte disturbances, Ongoing diagnostic testing needed not appropriate for outpatient work up and Inpatient level of care appropriate due to severity of illness  Dispo: The patient is from: Home              Anticipated d/c is to: Home              Anticipated d/c date is: 3 days pending adequate correction of hyponatremia.              Patient currently is not medically stable to d/c.   Zada Finders MD [facility]  If 7PM-7AM, please contact  night-coverage [email or web address]  07/14/2019, 1:29 AM

## 2019-07-14 NOTE — Consult Note (Addendum)
Renal Service Consult Note [name]  Crystal Jacobs 07/14/2019 Sol Blazing Requesting Physician: Dr Aileen Fass   Reason for Consult:  CKD V patient w/  hyponatremia HPI: The patient is a 59 y.o. year-old w/ hx of IDDM, HTN and advanced CKD sp AVF (march this year per pt) is visiting [place] on a 6 month VISA and presented to ED yest on 6/19 w/ c/o gen weakness x 2 weeks. She was constipated and took a laxative then developed nausea and vomiting. In ED serum Na+ was low at 106 yesterday, creat was 6.5.  Pt was felt to be vol depleted and NS 0.9% was started at 75 cc/hr.  Na overnight was up to 109 then 111 and this afternoon Na was 117 so the 0.9% NS was stopped and D5W started at 50/hr x 4 hrs.  We are asked to see for renal failrue and hyponatremia.   Pt states has been nauseous for about 10 days.  Diarrhea for just the last 1-2 days.  No abd pain, no CP, cough or SOB. States her AVF in [facility] was placed in March.  Appetite poor last 2 wks, but 1 mo ago she states was good.  She is planning to go back to [place] in October.    ROS  denies CP  no joint pain   no HA  no blurry vision  no rash  no dysuria  no difficulty voiding  no change in urine color    Past Medical History  Past Medical History:  Diagnosis Date  . Diabetes mellitus without complication ([place])   . Hypertension   . Renal disorder    Past Surgical History History reviewed. No pertinent surgical history. Family History No family history on file. Social History  reports that she has never smoked. She has never used smokeless tobacco. She reports previous alcohol use. No history on file for drug use. Allergies No Known Allergies Home medications Prior to Admission medications   Medication Sig Start Date End Date Taking? Authorizing Provider  ASPIRIN EC PO Take 100 mg by mouth daily at 8 pm. Patient takes a combination pill - Clopidogrel/Acetylsalicylic acid 75 [id number]   Yes [provider]   cholecalciferol (VITAMIN D3) 25 MCG (1000 UNIT) tablet Take 1,000 Units by mouth daily at 2 PM.   Yes [provider]  clopidogrel (PLAVIX) 75 MG tablet Take 75 mg by mouth daily at 8 pm. Patient takes a combination pill - Clopidogrel/Acetylsalicylic acid 75 [id number]   Yes [provider]  febuxostat (ULORIC) 40 MG tablet Take 40 mg by mouth daily at 2 PM.   Yes [provider]  insulin aspart (NOVOLOG) 100 UNIT/ML injection Inject 2-6 Units into the skin 3 (three) times daily with meals. 2-6 units with each meal depending on blood sugar   Yes [provider]  insulin glargine (LANTUS) 100 UNIT/ML injection Inject 4 Units into the skin 2 (two) times daily.   Yes [provider]  metoprolol succinate (TOPROL-XL) 50 MG 24 hr tablet Take 50 mg by mouth 2 (two) times daily. Cilidin M 65m/50mg - Cilnidipine 160m+Metoprolol Succinate 5018mobtained from [place] Yes [provider]  Multiple Vitamin (MULTIVITAMIN ADULT PO) Take 1 tablet by mouth daily at 8 pm.   Yes [provider]  pantoprazole (PROTONIX) 40 MG tablet Take 40 mg by mouth daily.   Yes [provider]  prazosin (MINIPRESS) 5 MG capsule Take 5 mg by mouth 2 (two)  times daily.   Yes [provider]  Probiotic Product (PROBIOTIC PO) Take 1 capsule by mouth 2 (two) times daily. Lobun Forte - streptococcus thermophilus   Yes [provider]  rosuvastatin (CRESTOR) 10 MG tablet Take 10 mg by mouth daily.   Yes [provider]  SODIUM BICARBONATE PO Take 500 mg by mouth 2 (two) times daily.   Yes [provider]     Vitals:   07/14/19 1105 07/14/19 1923 07/14/19 2007 07/14/19 2009  BP: (!) 125/45 (!) 138/55 (!) 128/53 95/70  Pulse: 80 76 76 79  Resp: 15 14    Temp: 98.2 F (36.8 C) 97.9 F (36.6 C)    TempSrc: Oral Oral    SpO2: 100% 100%    Weight:      Height:       Exam Gen thin and frail adult female, lying flat, no  distress No rash, cyanosis or gangrene Sclera anicteric, throat clear  No jvd or bruits, flat neck veins Chest clear bilat to bases, no rales RRR no MRG Abd soft ntnd no mass or ascites +bs GU defer MS no joint effusions or deformity Ext no LE or UE edema, no wounds or ulcers Neuro is alert, Ox 3 , gen'd weakness, nf, no asterixis LUA AVF immature/ +bruit    Home meds:  - metoprolol xl 50 bid/ prazosin 88mbid/ asa 81 mg/ crestor 10/ plavix 75  - insulin lantus 4u bid/ novolog 2-6u tid ac  - protonix 40 qd/ uloric 40 qd/ sod bicarb 650 bid    UA 6/19 - 100 prot, 0-5 rbc/ wbc, many bact   UNa 11, Uosm 173   CXR - IMPRESSION: No active disease.   Na+ 106 at 9pm yest, 111 at 0900 this am and 117 at 3:40 pm today    Was getting NS at 75 cc/hr.  DC'd w/ 117 result and D5W at 50 cc/ hr x 4 hrs is hanging now     Na 117  K 3.1  CO2 19  BUN 42  Cr 5.4 (6.5 on admit yest)    Alb 2.4  Ca 7.4  Phos 3.3  eGFR 7- 9      Tsat 65%, ferritin 1772        Hb 12  WBC 12K  plt 95k   Assessment/ Plan: 1. Hyponatremia - Na+ 106 on admit w/ orthostatic symptoms and recent' N/V, diarrhea.  Hypovolemic. Na + improving w/ normal saline up to 117 this afternoon so NS 0.9% stopped and is now getting D5W at 50/hr x 4 hrs. Goal is max of 8- 12 meq/L  Na correction per 24 hrs.  When Na+ is down to 116 and 9 pm passes will resume NS 0.9% this time at a slightly lower rate of 50 ml/hr. DC'd bicarb pills w/ quickly rising Na+ levels.  Check Na+ every 4 hours until > 120.  2. CKD advanced - had AVF placed in March.  Creat 6.5 > 5.5 here w/ IVF's.  Not sure is she is uremic or not, will need to correct Na+ and then reassess. [place] and [place] complicate [place] whole [place] process for her.  3. Hypovolemia - as above, cannot be real aggressive w; IVF"s until her Na+ is > 120- 125.  She is sig dry after N/V/D at home.  4. IDDM - per primary team      Rob Alberto Pina  MD 07/14/2019, 8:30 PM  Recent Labs  Lab 07/13/19 2101 07/14/19 0205  WBC 12.9* 12.0*  HGB 12.5 12.2   Recent Labs  Lab 07/14/19 0907 07/14/19 1542  K 3.6 3.1*  BUN 46* 42*  CREATININE 6.52* 5.49*  CALCIUM 8.4* 7.4*  PHOS 4.5 3.3

## 2019-07-14 NOTE — Progress Notes (Signed)
Spoke to Pt about doing her orthostatic B/P VS she wanted to wait until she ate something.  She and her daughter will let me know when she is ready

## 2019-07-14 NOTE — Progress Notes (Signed)
[facility] PROGRESS NOTE    Progress Note  Crystal Jacobs  YKD:[id number] DOB: 1960-10-23 DOA: 07/13/2019 PCP: Patient, No Pcp Per     Brief Narrative:   Crystal Jacobs is an 59 y.o. 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  Assessment/Plan:   Severe hypovolemic Hyponatremia: She still relates dizziness upon standing,  her urine sodium is, osmolarity 231 likely hypovolemic.  Continue IV fluids normal saline, recheck basic metabolic panel every 4 hours. Patient is still dizzy upon standing check orthostatics every 12 hours. Patient relates she does not like the food here I will authorize the patient daughter to bring her food from home. Out of bed to chair. Start physical therapy tomorrow morning.  CKD (chronic kidney disease), stage V ([place]) Patient has a fistula in the left arm with a good thrill, the daughter relates that her creatinine usually ranges around 7. Neurology has been consulted and appreciate assistance. I will ask pharmacy to do her med rec and started on her sodium bicarbonate tablets she is at home. Her potassium is 3.8 bicarbonate is 26-21. She has no rub on physical exam.  Chronic thrombocytopenia: Feeding, at home she takes aspirin and Plavix will continue these she said it was started by her nephrologist.  Microcytic anemia anemia of chronic renal disease: She states she has been receiving erythropoietin as an outpatient. Anemia panel is pending, her current hemoglobin is 12.5.  Insulin dependent type 2 diabetes mellitus (HCC) Her on low-dose Lantus plus sliding scale insulin. The patient improved from home.   DVT prophylaxis: lovenox Family Communication:daughter Status is: Inpatient  Remains inpatient appropriate because:Hemodynamically  unstable   Dispo: The patient is from: Home              Anticipated d/c is to: Home              Anticipated d/c date is: 3 days              Patient currently is not medically stable to d/c.        Code Status:     Code Status Orders  (From admission, onward)         Start     Ordered   07/14/19 0117  Full code  Continuous        07/14/19 0119        Code Status History    This patient has a current code status but no historical code status.   Advance Care Planning Activity        IV Access:    Peripheral IV   Procedures and diagnostic studies:   DG Chest Portable 1 View  Result Date: 07/13/2019 CLINICAL DATA:  59 year old female with chest pain. EXAM: PORTABLE CHEST 1 VIEW COMPARISON:  None. FINDINGS: The heart size and mediastinal contours are within normal limits. Both lungs are clear. The visualized skeletal structures are unremarkable. IMPRESSION: No active disease. Electronically Signed   By: Anner Crete M.D.   On: 07/13/2019 20:38     Medical Consultants:    None.  Anti-Infectives:   none  Subjective:    Crystal Jacobs she relates she still feels dizzy upon standing.  Objective:    Vitals:   07/14/19 0100 07/14/19 0437 07/14/19 0500 07/14/19 0530  BP:  (!) 157/60    Pulse: 70 69  67  Resp: 15  16  10  Temp:  (!) 95.5 F (35.3 C)  (!) 97.5 F (36.4 C)  TempSrc:  Rectal  Axillary  SpO2: 100% 100%  100%  Weight:   43.8 kg   Height:       SpO2: 100 %   Intake/Output Summary (Last 24 hours) at 07/14/2019 0659 Last data filed at 07/14/2019 1607 Gross per 24 hour  Intake 616.25 ml  Output --  Net 616.25 ml   Filed Weights   07/13/19 1835 07/14/19 0047 07/14/19 0500  Weight: 43.1 kg 43.8 kg 43.8 kg    Exam: General exam: In no acute distress. Respiratory system: Good air movement and clear to auscultation. Cardiovascular system: S1 & S2 heard, RRR. No JVD, rubs/ Gastrointestinal system: Abdomen is nondistended,  soft and nontender.  Extremities: No pedal edema, fistula in place in the left upper extremity with thrill Skin: No rashes, lesions or ulcers Psychiatry: Judgement and insight appear normal. Mood & affect appropriate.    Data Reviewed:    Labs: Basic Metabolic Panel: Recent Labs  Lab 07/13/19 2101 07/14/19 0205  NA 106* 109*  K 3.8 3.8  CL 65* 70*  CO2 26 21*  GLUCOSE 123* 78  BUN 51* 48*  CREATININE 6.73* 6.57*  CALCIUM 9.3 9.3   GFR Estimated Creatinine Clearance: 6.6 mL/min (A) (by C-G formula based on SCr of 6.57 mg/dL (H)). Liver Function Tests: Recent Labs  Lab 07/13/19 2101  AST 37  ALT 29  ALKPHOS 73  BILITOT 1.5*  PROT 7.0  ALBUMIN 3.7   Recent Labs  Lab 07/13/19 2101  LIPASE 47   No results for input(s): AMMONIA in the last 168 hours. Coagulation profile No results for input(s): INR, PROTIME in the last 168 hours. COVID-19 Labs  No results for input(s): DDIMER, FERRITIN, LDH, CRP in the last 72 hours.  Lab Results  Component Value Date   [place] NEGATIVE 07/13/2019    CBC: Recent Labs  Lab 07/13/19 2101 07/14/19 0205  WBC 12.9* 12.0*  NEUTROABS 10.8*  --   HGB 12.5 12.2  HCT 38.5 37.0  MCV 60.4* 59.7*  PLT 108* 95*   Cardiac Enzymes: No results for input(s): CKTOTAL, CKMB, CKMBINDEX, TROPONINI in the last 168 hours. BNP (last 3 results) No results for input(s): PROBNP in the last 8760 hours. CBG: Recent Labs  Lab 07/13/19 2239  GLUCAP 96   D-Dimer: No results for input(s): DDIMER in the last 72 hours. Hgb A1c: Recent Labs    07/14/19 0428  HGBA1C 6.2*   Lipid Profile: No results for input(s): CHOL, HDL, LDLCALC, TRIG, CHOLHDL, LDLDIRECT in the last 72 hours. Thyroid function studies: Recent Labs    07/13/19 2230  TSH 1.467   Anemia work up: No results for input(s): VITAMINB12, FOLATE, FERRITIN, TIBC, IRON, RETICCTPCT in the last 72 hours. Sepsis Labs: Recent Labs  Lab 07/13/19 2101 07/14/19 0205  WBC  12.9* 12.0*   Microbiology Recent Results (from the past 240 hour(s))  SARS Coronavirus 2 by RT PCR (hospital order, performed in [facility] hospital lab) Nasopharyngeal Nasopharyngeal Swab     Status: None   Collection Time: 07/13/19 10:30 PM   Specimen: Nasopharyngeal Swab  Result Value Ref Range Status   SARS Coronavirus 2 NEGATIVE NEGATIVE Final    Comment: (NOTE) SARS-CoV-2 target nucleic acids are NOT DETECTED.  The SARS-CoV-2 RNA is generally detectable in upper and lower respiratory specimens during the acute phase of infection. The lowest concentration of SARS-CoV-2 viral copies this assay  can detect is 250 copies / mL. A negative result does not preclude SARS-CoV-2 infection and should not be used as the sole basis for treatment or other patient management decisions.  A negative result may occur with improper specimen collection / handling, submission of specimen other than nasopharyngeal swab, presence of viral mutation(s) within the areas targeted by this assay, and inadequate number of viral copies (<250 copies / mL). A negative result must be combined with clinical observations, patient history, and epidemiological information.  Fact Sheet for Patients:   StrictlyIdeas.no  Fact Sheet for Healthcare Providers: BankingDealers.co.za  This test is not yet approved or  cleared by the [place] FDA and has been authorized for detection and/or diagnosis of SARS-CoV-2 by FDA under an Emergency Use Authorization (EUA).  This EUA will remain in effect (meaning this test can be used) for the duration of the COVID-19 declaration under Section 564(b)(1) of the Act, 21 U.S.C. section 360bbb-3(b)(1), unless the authorization is terminated or revoked sooner.  Performed at [facility], [address] [place], [place] [address]   MRSA PCR Screening     Status: None   Collection Time: 07/14/19 12:27 AM  Result Value  Ref Range Status   MRSA by PCR NEGATIVE NEGATIVE Final    Comment:        The GeneXpert MRSA Assay (FDA approved for NASAL specimens only), is one component of a comprehensive MRSA colonization surveillance program. It is not intended to diagnose MRSA infection nor to guide or monitor treatment for MRSA infections. Performed at [facility], [place] [address] [place], [place] [address]      Medications:   . heparin  5,000 Units Subcutaneous Q8H  . insulin aspart  0-9 Units Subcutaneous TID WC  . sodium chloride flush  3 mL Intravenous Q12H   Continuous Infusions: . sodium chloride 75 mL/hr at 07/14/19 0637      LOS: 0 days   Charlynne Cousins  [facility]  07/14/2019, 6:59 AM

## 2019-07-14 NOTE — Progress Notes (Signed)
CRITICAL VALUE ALERT  Critical Value:  Serum Osmolality=239  Date & Time Notified: 07/14/19 (01:48 am)  Provider Notified: Dr. Zada Finders  Orders Received/Actions taken: Yes

## 2019-07-14 NOTE — Progress Notes (Signed)
CRITICAL VALUE ALERT  Critical Value:  Sodium level 111  Date & Time Notied:  07/14/2019 at 10:20   Provider Notified: Charlynne Cousins, MD  Orders Received/Actions taken: None at this time. MD notified, expected value, up from 106 on admission and 109 from prior level.

## 2019-07-15 DIAGNOSIS — E878 Other disorders of electrolyte and fluid balance, not elsewhere classified: Secondary | ICD-10-CM | POA: Diagnosis present

## 2019-07-15 LAB — RENAL FUNCTION PANEL
Albumin: 2.6 g/dL — ABNORMAL LOW (ref 3.5–5.0)
Albumin: 3.2 g/dL — ABNORMAL LOW (ref 3.5–5.0)
Albumin: 2.9 g/dL — ABNORMAL LOW (ref 3.5–5.0)
Anion gap: 12 (ref 5–15)
Anion gap: 14 (ref 5–15)
Anion gap: 12 (ref 5–15)
BUN: 45 mg/dL — ABNORMAL HIGH (ref 6–20)
BUN: 43 mg/dL — ABNORMAL HIGH (ref 6–20)
BUN: 44 mg/dL — ABNORMAL HIGH (ref 6–20)
CO2: 20 mmol/L — ABNORMAL LOW (ref 22–32)
CO2: 19 mmol/L — ABNORMAL LOW (ref 22–32)
CO2: 20 mmol/L — ABNORMAL LOW (ref 22–32)
Calcium: 8.5 mg/dL — ABNORMAL LOW (ref 8.9–10.3)
Calcium: 8.8 mg/dL — ABNORMAL LOW (ref 8.9–10.3)
Calcium: 8.8 mg/dL — ABNORMAL LOW (ref 8.9–10.3)
Chloride: 84 mmol/L — ABNORMAL LOW (ref 98–111)
Chloride: 85 mmol/L — ABNORMAL LOW (ref 98–111)
Chloride: 91 mmol/L — ABNORMAL LOW (ref 98–111)
Creatinine, Ser: 6.49 mg/dL — ABNORMAL HIGH (ref 0.44–1.00)
Creatinine, Ser: 6.45 mg/dL — ABNORMAL HIGH (ref 0.44–1.00)
Creatinine, Ser: 6.39 mg/dL — ABNORMAL HIGH (ref 0.44–1.00)
GFR calc Af Amer: 8 mL/min — ABNORMAL LOW
GFR calc Af Amer: 8 mL/min — ABNORMAL LOW
GFR calc Af Amer: 8 mL/min — ABNORMAL LOW (ref >60)
GFR calc non Af Amer: 7 mL/min — ABNORMAL LOW
GFR calc non Af Amer: 7 mL/min — ABNORMAL LOW
GFR calc non Af Amer: 7 mL/min — ABNORMAL LOW (ref >60)
Glucose, Bld: 89 mg/dL (ref 70–99)
Glucose, Bld: 211 mg/dL — ABNORMAL HIGH (ref 70–99)
Glucose, Bld: 87 mg/dL (ref 70–99)
Phosphorus: 4.2 mg/dL (ref 2.5–4.6)
Phosphorus: 3.9 mg/dL (ref 2.5–4.6)
Phosphorus: 3.8 mg/dL (ref 2.5–4.6)
Potassium: 3.9 mmol/L (ref 3.5–5.1)
Potassium: 4.4 mmol/L (ref 3.5–5.1)
Potassium: 5.3 mmol/L — ABNORMAL HIGH (ref 3.5–5.1)
Sodium: 116 mmol/L — CL (ref 135–145)
Sodium: 118 mmol/L — CL (ref 135–145)
Sodium: 123 mmol/L — ABNORMAL LOW (ref 135–145)

## 2019-07-15 LAB — SODIUM
Sodium: 115 mmol/L — CL (ref 135–145)
Sodium: 118 mmol/L — CL (ref 135–145)

## 2019-07-15 LAB — GLUCOSE, CAPILLARY
Glucose-Capillary: 136 mg/dL — ABNORMAL HIGH (ref 70–99)
Glucose-Capillary: 57 mg/dL — ABNORMAL LOW (ref 70–99)
Glucose-Capillary: 76 mg/dL (ref 70–99)
Glucose-Capillary: 113 mg/dL — ABNORMAL HIGH (ref 70–99)
Glucose-Capillary: 195 mg/dL — ABNORMAL HIGH (ref 70–99)
Glucose-Capillary: 115 mg/dL — ABNORMAL HIGH (ref 70–99)

## 2019-07-15 NOTE — Plan of Care (Signed)

## 2019-07-15 NOTE — Progress Notes (Signed)
Inpatient Diabetes Program Recommendations  AACE/ADA: New Consensus Statement on Inpatient Glycemic Control (2015)  Target Ranges:  Prepandial:   less than 140 mg/dL      Peak postprandial:   less than 180 mg/dL (1-2 hours)      Critically ill patients:  140 - 180 mg/dL   Lab Results  Component Value Date   GLUCAP 113 (H) 07/15/2019   HGBA1C 6.2 (H) 07/14/2019    Review of Glycemic Control Results for EDANA, AGUADO (MRN [id number]) as of 07/15/2019 12:13  Ref. Range 07/14/2019 21:11 07/15/2019 02:21 07/15/2019 06:46 07/15/2019 07:19 07/15/2019 11:22  Glucose-Capillary Latest Ref Range: 70 - 99 mg/dL 108 (H) 136 (H) 57 (L) 76 113 (H)   Diabetes history:  DM2  Outpatient Diabetes medications:  Levemir 4 units bid Novolog 2-6 units tid  Current orders for Inpatient glycemic control:  Levemir 2 units qhs  Novolog 2 units meal coverage tid Novolog 0-6 units tid  Inpatient Diabetes Program Recommendations:     Patient received meal coverage twice yesterday and Levemir 2 units last evening.    Please discontinue Novolog 2 units of meal coverage tid  Will continue to follow while inpatient.  Thank you, Reche Dixon, RN, BSN Diabetes Coordinator Inpatient Diabetes Program [phone] (team pager from 8a-5p)

## 2019-07-15 NOTE — Progress Notes (Addendum)
[facility] PROGRESS NOTE    Progress Note  Crystal Jacobs  KWI:[id number] DOB: 1960-02-21 DOA: 07/13/2019 PCP: Patient, No Pcp Per     Brief Narrative:   Crystal Jacobs is an 59 y.o. female medical history significant for insulin-dependent diabetes mellitus, essential hypertension chronic kidney disease stage V with a history of nephrostomy presents to [facility] for evaluation of generalized weakness.  Assessment/Plan:   Severe hypovolemic Hyponatremia/hypochloremia: Still dizzy upon standing.  She had to be put for about 4 hours on D5 due to the fact rising sodium. Has been placed back to normal saline Sodium is 115 continue normal saline at current rate we will try not to correct more than 12 mEq at a 24-hour period. Out of bed to chair consult physical therapy.  CKD (chronic kidney disease), stage V ([place]) Patient has a fistula in the left arm with a good thrill, the daughter relates that her creatinine usually ranges around 7. Nephrology's assistance. No emergent need for hemodialysis.  Chronic thrombocytopenia: Feeding, at home she takes aspirin and Plavix will continue these she said it was started by her nephrologist.  Microcytic anemia anemia of chronic renal disease: She states she has been receiving erythropoietin as an outpatient. Anemia panel is pending, her current hemoglobin is 12.5.  Insulin dependent type 2 diabetes mellitus (HCC) Her on low-dose Lantus plus sliding scale insulin. The patient improved from home.  Hypokalemia: Likely due to hypovolemia replete orally recheck tomorrow morning.  Underweight: Continue her renal diet.   DVT prophylaxis: lovenox Family Communication:daughter and son Status is: Inpatient  Remains inpatient appropriate because:Hemodynamically unstable   Dispo: The patient is from: Home              Anticipated d/c is to: Home              Anticipated d/c date is: 3 days              Patient currently  is not medically stable to d/c.  Transfer to MedSurg    Code Status:     Code Status Orders  (From admission, onward)           Start     Ordered   07/14/19 0117  Full code  Continuous        07/14/19 0119           Code Status History     This patient has a current code status but no historical code status.   Advance Care Planning Activity         IV Access:   Peripheral IV   Procedures and diagnostic studies:   DG Chest Portable 1 View  Result Date: 07/13/2019 CLINICAL DATA:  59 year old female with chest pain. EXAM: PORTABLE CHEST 1 VIEW COMPARISON:  None. FINDINGS: The heart size and mediastinal contours are within normal limits. Both lungs are clear. The visualized skeletal structures are unremarkable. IMPRESSION: No active disease. Electronically Signed   By: Anner Crete M.D.   On: 07/13/2019 20:38     Medical Consultants:   None.  Anti-Infectives:   none  Subjective:    Crystal Jacobs still dizzy upon standing.  Objective:    Vitals:   07/14/19 2009 07/14/19 2342 07/15/19 0300 07/15/19 0357  BP: 95/70 (!) 163/60 (!) 134/49   Pulse: 79 85 74   Resp:  (!) 21 17   Temp:  (!) 97.5 F (36.4 C) 98.5 F (36.9 C)   TempSrc:  Oral Oral  SpO2:  100% 100%   Weight:    44.2 kg  Height:       SpO2: 100 %   Intake/Output Summary (Last 24 hours) at 07/15/2019 0728 Last data filed at 07/15/2019 0616 Gross per 24 hour  Intake 930.63 ml  Output 1400 ml  Net -469.37 ml   Filed Weights   07/14/19 0047 07/14/19 0500 07/15/19 0357  Weight: 43.8 kg 43.8 kg 44.2 kg    Exam: General exam: In no acute distress. Respiratory system: Good air movement and clear to auscultation. Cardiovascular system: S1 & S2 heard, RRR. No JVD. Gastrointestinal system: Abdomen is nondistended, soft and nontender.  Central nervous system: Alert and oriented. No focal neurological deficits. Extremities: No pedal edema. Skin: No rashes, lesions or  ulcers Psychiatry: Judgement and insight appear normal.    Data Reviewed:    Labs: Basic Metabolic Panel: Recent Labs  Lab 07/13/19 2101 07/13/19 2101 07/14/19 0205 07/14/19 0205 07/14/19 0907 07/14/19 1542 07/14/19 2139 07/15/19 0142  NA 106*   < > 109*  --  111* 117* 113* 115*  K 3.8   < > 3.8   < > 3.6 3.1*  --   --   CL 65*  --  70*  --  75* 87*  --   --   CO2 26  --  21*  --  23 19*  --   --   GLUCOSE 123*  --  78  --  80 224*  --   --   BUN 51*  --  48*  --  46* 42*  --   --   CREATININE 6.73*  --  6.57*  --  6.52* 5.49*  --   --   CALCIUM 9.3  --  9.3  --  8.4* 7.4*  --   --   PHOS  --   --   --   --  4.5 3.3  --   --    < > = values in this interval not displayed.   GFR Estimated Creatinine Clearance: 8 mL/min (A) (by C-G formula based on SCr of 5.49 mg/dL (H)). Liver Function Tests: Recent Labs  Lab 07/13/19 2101 07/14/19 0907 07/14/19 1542  AST 37  --   --   ALT 29  --   --   ALKPHOS 73  --   --   BILITOT 1.5*  --   --   PROT 7.0  --   --   ALBUMIN 3.7 2.6* 2.4*   Recent Labs  Lab 07/13/19 2101  LIPASE 47   No results for input(s): AMMONIA in the last 168 hours. Coagulation profile No results for input(s): INR, PROTIME in the last 168 hours. COVID-19 Labs  Recent Labs    07/14/19 0907  FERRITIN 1,772*    Lab Results  Component Value Date   SARSCOV2NAA NEGATIVE 07/13/2019    CBC: Recent Labs  Lab 07/13/19 2101 07/14/19 0205  WBC 12.9* 12.0*  NEUTROABS 10.8*  --   HGB 12.5 12.2  HCT 38.5 37.0  MCV 60.4* 59.7*  PLT 108* 95*   Cardiac Enzymes: No results for input(s): CKTOTAL, CKMB, CKMBINDEX, TROPONINI in the last 168 hours. BNP (last 3 results) No results for input(s): PROBNP in the last 8760 hours. CBG: Recent Labs  Lab 07/14/19 1847 07/14/19 2111 07/15/19 0221 07/15/19 0646 07/15/19 0719  GLUCAP 191* 108* 136* 57* 76   D-Dimer: No results for input(s): DDIMER in the last 72 hours. Hgb A1c: Recent Labs  07/14/19 0428  HGBA1C 6.2*   Lipid Profile: No results for input(s): CHOL, HDL, LDLCALC, TRIG, CHOLHDL, LDLDIRECT in the last 72 hours. Thyroid function studies: Recent Labs    07/13/19 2230  TSH 1.467   Anemia work up: Recent Labs    07/14/19 0907  VITAMINB12 3,305*  FOLATE >100.0  FERRITIN 1,772*  TIBC 150*  IRON 97   Sepsis Labs: Recent Labs  Lab 07/13/19 2101 07/14/19 0205  WBC 12.9* 12.0*   Microbiology Recent Results (from the past 240 hour(s))  SARS Coronavirus 2 by RT PCR (hospital order, performed in [facility] hospital lab) Nasopharyngeal Nasopharyngeal Swab     Status: None   Collection Time: 07/13/19 10:30 PM   Specimen: Nasopharyngeal Swab  Result Value Ref Range Status   SARS Coronavirus 2 NEGATIVE NEGATIVE Final    Comment: (NOTE) SARS-CoV-2 target nucleic acids are NOT DETECTED.  The SARS-CoV-2 RNA is generally detectable in upper and lower respiratory specimens during the acute phase of infection. The lowest concentration of SARS-CoV-2 viral copies this assay can detect is 250 copies / mL. A negative result does not preclude SARS-CoV-2 infection and should not be used as the sole basis for treatment or other patient management decisions.  A negative result may occur with improper specimen collection / handling, submission of specimen other than nasopharyngeal swab, presence of viral mutation(s) within the areas targeted by this assay, and inadequate number of viral copies (<250 copies / mL). A negative result must be combined with clinical observations, patient history, and epidemiological information.  Fact Sheet for Patients:   StrictlyIdeas.no  Fact Sheet for Healthcare Providers: BankingDealers.co.za  This test is not yet approved or  cleared by the [place] FDA and has been authorized for detection and/or diagnosis of SARS-CoV-2 by FDA under an Emergency Use Authorization (EUA).  This  EUA will remain in effect (meaning this test can be used) for the duration of the COVID-19 declaration under Section 564(b)(1) of the Act, 21 U.S.C. section 360bbb-3(b)(1), unless the authorization is terminated or revoked sooner.  Performed at [facility], [address] [place], [place] [address]   MRSA PCR Screening     Status: None   Collection Time: 07/14/19 12:27 AM  Result Value Ref Range Status   MRSA by PCR NEGATIVE NEGATIVE Final    Comment:        The GeneXpert MRSA Assay (FDA approved for NASAL specimens only), is one component of a comprehensive MRSA colonization surveillance program. It is not intended to diagnose MRSA infection nor to guide or monitor treatment for MRSA infections. Performed at [facility], [place] [address], [place], [place] [address]      Medications:    aspirin EC  81 mg Oral Daily   cholecalciferol  1,000 Units Oral Q1400   clopidogrel  75 mg Oral Daily   febuxostat  40 mg Oral Q1400   heparin  5,000 Units Subcutaneous Q8H   insulin aspart  0-6 Units Subcutaneous TID WC   insulin aspart  2 Units Subcutaneous TID WC   insulin detemir  2 Units Subcutaneous QHS   potassium chloride  20 mEq Oral BID   sodium chloride flush  3 mL Intravenous Q12H   Continuous Infusions:  sodium chloride 75 mL/hr at 07/15/19 0646      LOS: 1 day   Charlynne Cousins  [facility]  07/15/2019, 7:28 AM

## 2019-07-15 NOTE — Progress Notes (Signed)
[facility] Progress Note    Assessment/ Plan:   1. Hyponatremia - Na+ 106 on admit 6/19 w/ orthostatic symptoms and recent N/V, diarrhea.  Hypovolemic. Na + improving w/ normal saline up to 117 6/20, s/p D5W at 50/hr x 4 hrs. Goal is max of 8- 12 meq/L  Na correction per 24 hrs.  NS resumed at 75/ hr overnight, labs pending for this AM.  Na bicarb pills stopped as well.     2. CKD advanced - had AVF placed in March.  Creat 6.5 > 5.5 here w/ IVF's.  Not sure is she is uremic or not, will need to correct Na+ and then reassess. Will try and get by w/o HD here in the [place] as this could potentially complicate the whole ESRD process for her.   3. Hypovolemia - as above, cannot be real aggressive w; IVF"s until her Na+ is > 120- 125.  She is sig dry after N/V/D at home.   4. IDDM - per primary team  Subjective:    Feeling better.  Pt requested no labs before 7 am so they are pending.  NS @ 75 mL/ hr going s/p D5W mx 4 hrs.  Last Na 114 at 0200.     Objective:   BP (!) 134/49 (BP Location: Right Arm)   Pulse 74   Temp 98.5 F (36.9 C) (Oral)   Resp 17   Ht 5\' 2"  (1.575 m)   Wt 44.2 kg   SpO2 100%   BMI 17.82 kg/m   Intake/Output Summary (Last 24 hours) at 07/15/2019 1015 Last data filed at 07/15/2019 4097 Gross per 24 hour  Intake 927.63 ml  Output 1400 ml  Net -472.37 ml   Weight change: 1.108 kg  Physical Exam: Gen: NAD, lying in bed CVS: RRR Resp: clear  Abd: soft Ext: no LE edema ACCESS: LUE AVF + T/B  Imaging: DG Chest Portable 1 View  Result Date: 07/13/2019 CLINICAL DATA:  59 year old female with chest pain. EXAM: PORTABLE CHEST 1 VIEW COMPARISON:  None. FINDINGS: The heart size and mediastinal contours are within normal limits. Both lungs are clear. The visualized skeletal structures are unremarkable. IMPRESSION: No active disease. Electronically Signed   By: Anner Crete M.D.   On: 07/13/2019 20:38    Labs: BMET Recent Labs  Lab 07/13/19 2101  07/14/19 0205 07/14/19 0907 07/14/19 1542 07/14/19 2139 07/15/19 0142  NA 106* 109* 111* 117* 113* 115*  K 3.8 3.8 3.6 3.1*  --   --   CL 65* 70* 75* 87*  --   --   CO2 26 21* 23 19*  --   --   GLUCOSE 123* 78 80 224*  --   --   BUN 51* 48* 46* 42*  --   --   CREATININE 6.73* 6.57* 6.52* 5.49*  --   --   CALCIUM 9.3 9.3 8.4* 7.4*  --   --   PHOS  --   --  4.5 3.3  --   --    CBC Recent Labs  Lab 07/13/19 2101 07/14/19 0205  WBC 12.9* 12.0*  NEUTROABS 10.8*  --   HGB 12.5 12.2  HCT 38.5 37.0  MCV 60.4* 59.7*  PLT 108* 95*    Medications:    . aspirin EC  81 mg Oral Daily  . cholecalciferol  1,000 Units Oral Q1400  . clopidogrel  75 mg Oral Daily  . febuxostat  40 mg Oral Q1400  . heparin  5,000 Units Subcutaneous Q8H  . insulin aspart  0-6 Units Subcutaneous TID WC  . insulin aspart  2 Units Subcutaneous TID WC  . insulin detemir  2 Units Subcutaneous QHS  . potassium chloride  20 mEq Oral BID  . sodium chloride flush  3 mL Intravenous Q12H      Madelon Lips, MD 07/15/2019, 10:15 AM

## 2019-07-15 NOTE — Plan of Care (Signed)
  Problem: Education: Goal: Knowledge of General Education information will improve Description: Including pain rating scale, medication(s)/side effects and non-pharmacologic comfort measures 07/15/2019 0115 by Mikey Bussing, RN Outcome: Progressing 07/15/2019 0115 by Mikey Bussing, RN Outcome: Progressing   Problem: Clinical Measurements: Goal: Ability to maintain clinical measurements within normal limits will improve 07/15/2019 0115 by Mikey Bussing, RN Outcome: Progressing 07/15/2019 0115 by Mikey Bussing, RN Outcome: Progressing Goal: Will remain free from infection 07/15/2019 0115 by Mikey Bussing, RN Outcome: Progressing 07/15/2019 0115 by Mikey Bussing, RN Outcome: Progressing Goal: Diagnostic test results will improve 07/15/2019 0115 by Mikey Bussing, RN Outcome: Progressing 07/15/2019 0115 by Mikey Bussing, RN Outcome: Progressing Goal: Respiratory complications will improve 07/15/2019 0115 by Mikey Bussing, RN Outcome: Progressing 07/15/2019 0115 by Mikey Bussing, RN Outcome: Progressing Goal: Cardiovascular complication will be avoided 07/15/2019 0115 by Mikey Bussing, RN Outcome: Progressing 07/15/2019 0115 by Mikey Bussing, RN Outcome: Progressing   Problem: Activity: Goal: Risk for activity intolerance will decrease 07/15/2019 0115 by Mikey Bussing, RN Outcome: Progressing 07/15/2019 0115 by Mikey Bussing, RN Outcome: Progressing   Problem: Nutrition: Goal: Adequate nutrition will be maintained 07/15/2019 0115 by Mikey Bussing, RN Outcome: Progressing 07/15/2019 0115 by Mikey Bussing, RN Outcome: Progressing   Problem: Coping: Goal: Level of anxiety will decrease 07/15/2019 0115 by Mikey Bussing, RN Outcome: Progressing 07/15/2019 0115 by Mikey Bussing, RN Outcome: Progressing   Problem: Elimination: Goal: Will not experience complications related to bowel motility 07/15/2019 0115 by Mikey Bussing, RN Outcome: Progressing 07/15/2019 0115 by Mikey Bussing, RN Outcome: Progressing Goal: Will not  experience complications related to urinary retention 07/15/2019 0115 by Mikey Bussing, RN Outcome: Progressing 07/15/2019 0115 by Mikey Bussing, RN Outcome: Progressing   Problem: Pain Managment: Goal: General experience of comfort will improve 07/15/2019 0115 by Mikey Bussing, RN Outcome: Progressing 07/15/2019 0115 by Mikey Bussing, RN Outcome: Progressing   Problem: Safety: Goal: Ability to remain free from injury will improve 07/15/2019 0115 by Mikey Bussing, RN Outcome: Progressing 07/15/2019 0115 by Mikey Bussing, RN Outcome: Progressing   Problem: Skin Integrity: Goal: Risk for impaired skin integrity will decrease 07/15/2019 0115 by Mikey Bussing, RN Outcome: Progressing 07/15/2019 0115 by Mikey Bussing, RN Outcome: Progressing

## 2019-07-15 NOTE — Progress Notes (Signed)
CRITICAL VALUE ALERT  Critical Value:  cbg 57  Date & Time Notied:  07/15/19 0650  Provider Notified: No  Orders Received/Actions taken: juice given, will recheck in 15-20 minutes

## 2019-07-15 NOTE — Evaluation (Addendum)
Physical Therapy Evaluation Patient Details Name: Crystal Jacobs MRN: [id number] DOB: 1960/06/15 Today's Date: 07/15/2019   History of Present Illness  Pt adm with weakness and found to be hyponatremic and with orthostatic hypotension. PMH - DM, CKD, htn, nephrectomy  Clinical Impression  Pt presents with decr in mobility after several days of illness and inactivity complicated by orthostatic hypotension. Pt with improved BP today and able to amb in hallway. Expect mobility will continue to improve as medical status continues to improve.  Orthostatic BPs  Supine 157/57  Sitting 143/52  Standing 149/52  Standing after 3 min 137/52   After amb BP 140's/50's    Follow Up Recommendations No PT follow up    Equipment Recommendations  None recommended by PT    Recommendations for Other Services       Precautions / Restrictions Precautions Precautions: Other (comment) Precaution Comments: watch orthostatics      Mobility  Bed Mobility Overal bed mobility: Modified Independent                Transfers Overall transfer level: Needs assistance Equipment used: None;4-wheeled walker Transfers: Sit to/from Stand Sit to Stand: Supervision         General transfer comment: Incr time to rise  Ambulation/Gait Ambulation/Gait assistance: Supervision Gait Distance (Feet): 200 Feet Assistive device: 4-wheeled walker Gait Pattern/deviations: Step-through pattern;Decreased stride length Gait velocity: decr Gait velocity interpretation: 1.31 - 2.62 ft/sec, indicative of limited community ambulator General Gait Details: Supervision for lines/safety. Slightly unsteady initially but no overt loss of balance. Primarily used rollator for Product/process development scientist    Modified Rankin (Stroke Patients Only)       Balance Overall balance assessment: Needs assistance Sitting-balance support: No upper extremity supported;Feet  supported Sitting balance-Leahy Scale: Normal     Standing balance support: No upper extremity supported Standing balance-Leahy Scale: Fair                               Pertinent Vitals/Pain Pain Assessment: Faces Faces Pain Scale: No hurt    Home Living Family/patient expects to be discharged to:: Private residence Living Arrangements: Children Available Help at Discharge: Family;Available 24 hours/day Type of Home: House         Home Equipment: None      Prior Function Level of Independence: Independent               Hand Dominance        Extremity/Trunk Assessment   Upper Extremity Assessment Upper Extremity Assessment: Generalized weakness    Lower Extremity Assessment Lower Extremity Assessment: Generalized weakness       Communication   Communication: No difficulties  Cognition Arousal/Alertness: Awake/alert Behavior During Therapy: WFL for tasks assessed/performed Overall Cognitive Status: Within Functional Limits for tasks assessed                                        General Comments General comments (skin integrity, edema, etc.): See assessment for orthostatics. VSS.    Exercises     Assessment/Plan    PT Assessment Patient needs continued PT services  PT Problem List Decreased strength;Decreased activity tolerance;Decreased balance;Decreased mobility       PT Treatment Interventions DME instruction;Gait training;Functional mobility training;Therapeutic activities;Therapeutic exercise;Balance training;Patient/family education  PT Goals (Current goals can be found in the Care Plan section)  Acute Rehab PT Goals Patient Stated Goal: not stated PT Goal Formulation: With patient Time For Goal Achievement: 07/29/19 Potential to Achieve Goals: Good    Frequency Min 3X/week   Barriers to discharge        Co-evaluation               AM-PAC PT "6 Clicks" Mobility  Outcome Measure Help  needed turning from your back to your side while in a flat bed without using bedrails?: None Help needed moving from lying on your back to sitting on the side of a flat bed without using bedrails?: None Help needed moving to and from a bed to a chair (including a wheelchair)?: A Little Help needed standing up from a chair using your arms (e.g., wheelchair or bedside chair)?: A Little Help needed to walk in hospital room?: A Little Help needed climbing 3-5 steps with a railing? : A Little 6 Click Score: 20    End of Session Equipment Utilized During Treatment: Gait belt Activity Tolerance: Patient tolerated treatment well Patient left: in chair;with call bell/phone within reach;with nursing/sitter in room Nurse Communication: Mobility status PT Visit Diagnosis: Muscle weakness (generalized) (M62.81);Other abnormalities of gait and mobility (R26.89)    Time: 0921-0950 PT Time Calculation (min) (ACUTE ONLY): 29 min   Charges:   PT Evaluation $PT Eval Moderate Complexity: 1 Mod PT Treatments $Gait Training: 8-22 mins        [place] Pager [phone] Office [place] 07/15/2019, 10:16 AM

## 2019-07-16 ENCOUNTER — Encounter (HOSPITAL_COMMUNITY): Payer: Self-pay | Admitting: Internal Medicine

## 2019-07-16 LAB — RENAL FUNCTION PANEL
Albumin: 2.7 g/dL — ABNORMAL LOW (ref 3.5–5.0)
Albumin: 2.9 g/dL — ABNORMAL LOW (ref 3.5–5.0)
Albumin: 2.7 g/dL — ABNORMAL LOW (ref 3.5–5.0)
Albumin: 2.9 g/dL — ABNORMAL LOW (ref 3.5–5.0)
Anion gap: 9 (ref 5–15)
Anion gap: 10 (ref 5–15)
Anion gap: 13 (ref 5–15)
Anion gap: 11 (ref 5–15)
BUN: 43 mg/dL — ABNORMAL HIGH (ref 6–20)
BUN: 39 mg/dL — ABNORMAL HIGH (ref 6–20)
BUN: 40 mg/dL — ABNORMAL HIGH (ref 6–20)
BUN: 39 mg/dL — ABNORMAL HIGH (ref 6–20)
CO2: 18 mmol/L — ABNORMAL LOW (ref 22–32)
CO2: 21 mmol/L — ABNORMAL LOW (ref 22–32)
CO2: 17 mmol/L — ABNORMAL LOW (ref 22–32)
CO2: 18 mmol/L — ABNORMAL LOW (ref 22–32)
Calcium: 8.6 mg/dL — ABNORMAL LOW (ref 8.9–10.3)
Calcium: 8.8 mg/dL — ABNORMAL LOW (ref 8.9–10.3)
Calcium: 9 mg/dL (ref 8.9–10.3)
Calcium: 8.9 mg/dL (ref 8.9–10.3)
Chloride: 96 mmol/L — ABNORMAL LOW (ref 98–111)
Chloride: 95 mmol/L — ABNORMAL LOW (ref 98–111)
Chloride: 96 mmol/L — ABNORMAL LOW (ref 98–111)
Chloride: 94 mmol/L — ABNORMAL LOW (ref 98–111)
Creatinine, Ser: 6.26 mg/dL — ABNORMAL HIGH (ref 0.44–1.00)
Creatinine, Ser: 5.99 mg/dL — ABNORMAL HIGH (ref 0.44–1.00)
Creatinine, Ser: 6.09 mg/dL — ABNORMAL HIGH (ref 0.44–1.00)
Creatinine, Ser: 6.03 mg/dL — ABNORMAL HIGH (ref 0.44–1.00)
GFR calc Af Amer: 8 mL/min — ABNORMAL LOW (ref >60)
GFR calc Af Amer: 8 mL/min — ABNORMAL LOW (ref >60)
GFR calc Af Amer: 8 mL/min — ABNORMAL LOW (ref >60)
GFR calc Af Amer: 8 mL/min — ABNORMAL LOW (ref >60)
GFR calc non Af Amer: 7 mL/min — ABNORMAL LOW (ref >60)
GFR calc non Af Amer: 7 mL/min — ABNORMAL LOW (ref >60)
GFR calc non Af Amer: 7 mL/min — ABNORMAL LOW (ref >60)
GFR calc non Af Amer: 7 mL/min — ABNORMAL LOW (ref >60)
Glucose, Bld: 112 mg/dL — ABNORMAL HIGH (ref 70–99)
Glucose, Bld: 64 mg/dL — ABNORMAL LOW (ref 70–99)
Glucose, Bld: 182 mg/dL — ABNORMAL HIGH (ref 70–99)
Glucose, Bld: 130 mg/dL — ABNORMAL HIGH (ref 70–99)
Phosphorus: 3.9 mg/dL (ref 2.5–4.6)
Phosphorus: 3.5 mg/dL (ref 2.5–4.6)
Phosphorus: 3.4 mg/dL (ref 2.5–4.6)
Phosphorus: 3.4 mg/dL (ref 2.5–4.6)
Potassium: 5.9 mmol/L — ABNORMAL HIGH (ref 3.5–5.1)
Potassium: 4.5 mmol/L (ref 3.5–5.1)
Potassium: 4.7 mmol/L (ref 3.5–5.1)
Potassium: 4.4 mmol/L (ref 3.5–5.1)
Sodium: 123 mmol/L — ABNORMAL LOW (ref 135–145)
Sodium: 126 mmol/L — ABNORMAL LOW (ref 135–145)
Sodium: 126 mmol/L — ABNORMAL LOW (ref 135–145)
Sodium: 123 mmol/L — ABNORMAL LOW (ref 135–145)

## 2019-07-16 LAB — GLUCOSE, CAPILLARY
Glucose-Capillary: 83 mg/dL (ref 70–99)
Glucose-Capillary: 61 mg/dL — ABNORMAL LOW (ref 70–99)
Glucose-Capillary: 76 mg/dL (ref 70–99)
Glucose-Capillary: 154 mg/dL — ABNORMAL HIGH (ref 70–99)
Glucose-Capillary: 75 mg/dL (ref 70–99)
Glucose-Capillary: 132 mg/dL — ABNORMAL HIGH (ref 70–99)

## 2019-07-16 MED ORDER — SODIUM BICARBONATE 650 MG PO TABS
650.0000 mg | ORAL_TABLET | Freq: Two times a day (BID) | ORAL | Status: DC
  Administered 2019-07-16 – 2019-07-17 (×3): 650 mg via ORAL
  Filled 2019-07-16 (×3): qty 1

## 2019-07-16 MED ORDER — PATIROMER SORBITEX CALCIUM 8.4 G PO PACK
16.8000 g | PACK | Freq: Once | ORAL | Status: AC
  Administered 2019-07-16: 16.8 g via ORAL
  Filled 2019-07-16: qty 2

## 2019-07-16 NOTE — Progress Notes (Signed)
[facility] PROGRESS NOTE    Progress Note  Crystal Jacobs  WCH:[id number] DOB: [birth year] DOA: 07/13/2019 PCP: Patient, No Pcp Per     Brief Narrative:   Crystal Jacobs is an 59 y.o. female medical history significant for insulin-dependent diabetes mellitus, essential hypertension chronic kidney disease stage V with a history of nephrostomy presents to [facility] for evaluation of generalized weakness.  Assessment/Plan:   Severe hypovolemic Hyponatremia/hypochloremia: Still dizzy upon standing.  Her sodium continues to improve nicely with normal saline, is 123 once above 126 we will start thinking about discharging her home. Physical therapy recommended no follow-up.  CKD (chronic kidney disease), stage V ([place]) Patient has a fistula in the left arm with a good thrill, the daughter relates that her creatinine usually ranges around 7. Nephrology's assistance. No emergent need for hemodialysis.  Chronic thrombocytopenia: At home she takes aspirin and Plavix. Will need to be follow-up as an outpatient continue to monitor intermittently.  Microcytic anemia anemia of chronic renal disease: She states she has been receiving erythropoietin as an outpatient. Anemia panel is pending, her current hemoglobin is 12.5.  Insulin dependent type 2 diabetes mellitus (HCC) Her on low-dose Lantus plus sliding scale insulin. The patient improved from home.  Hypokalemia/hyperkalemia:  Likely due to oral supplementation we will give her a single dose of Kayexalate.  Underweight: Continue her renal diet.   DVT prophylaxis: lovenox Family Communication:daughter and Jacobs Status is: Inpatient  Remains inpatient appropriate because:Hemodynamically unstable   Dispo: The patient is from: Home              Anticipated d/c is to: Home              Anticipated d/c date is: 24 to 48 hours.              Patient currently is not medically stable to d/c.  She can be discharged  once her sodium is closer to 130.    Code Status:     Code Status Orders  (From admission, onward)           Start     Ordered   07/14/19 0117  Full code  Continuous        07/14/19 0119           Code Status History     This patient has a current code status but no historical code status.   Advance Care Planning Activity         IV Access:    Peripheral IV   Procedures and diagnostic studies:   No results found.   Medical Consultants:    None.  Anti-Infectives:   none  Subjective:    Crystal Jacobs dizziness upon standing has resolved she is tolerating her diet.  Objective:    Vitals:   07/15/19 1936 07/15/19 2351 07/16/19 0317 07/16/19 0745  BP: (!) 152/58 (!) 160/53 (!) 158/55 (!) 158/63  Pulse: 69 80 77 74  Resp: 12 14 18 16   Temp: 97.6 F (36.4 C) (!) 97.4 F (36.3 C) (!) 97.4 F (36.3 C) 97.6 F (36.4 C)  TempSrc: Oral Oral Oral Oral  SpO2: 100%  100% 100%  Weight:      Height:       SpO2: 100 %   Intake/Output Summary (Last 24 hours) at 07/16/2019 1002 Last data filed at 07/15/2019 1945 Gross per 24 hour  Intake --  Output 350 ml  Net -350 ml   [employer]  07/14/19 0047 07/14/19 0500 07/15/19 0357  Weight: 43.8 kg 43.8 kg 44.2 kg    Exam: General exam: In no acute distress. Respiratory system: Good air movement and clear to auscultation. Cardiovascular system: S1 & S2 heard, RRR. No JVD. Gastrointestinal system: Abdomen is nondistended, soft and nontender.  Extremities: No pedal edema. Skin: No rashes, lesions or ulcers Psychiatry: Judgement and insight appear normal. Mood & affect appropriate.  Data Reviewed:    Labs: Basic Metabolic Panel: Recent Labs  Lab 07/14/19 1542 07/14/19 1542 07/14/19 2139 07/15/19 0142 07/15/19 0924 07/15/19 0924 07/15/19 1644 07/15/19 1644 07/15/19 2256 07/16/19 0537  NA 117*  --    < > 115* 116*  --  118*  118*  --  123* 123*  K 3.1*   < >  --   --  3.9   < >  4.4   < > 5.3* 5.9*  CL 87*  --   --   --  84*  --  85*  --  91* 96*  CO2 19*  --   --   --  20*  --  19*  --  20* 18*  GLUCOSE 224*  --   --   --  89  --  211*  --  87 112*  BUN 42*  --   --   --  45*  --  43*  --  44* 43*  CREATININE 5.49*  --   --   --  6.49*  --  6.45*  --  6.39* 6.26*  CALCIUM 7.4*  --   --   --  8.5*  --  8.8*  --  8.8* 8.6*  PHOS 3.3  --   --   --  4.2  --  3.9  --  3.8 3.9   < > = values in this interval not displayed.   GFR Estimated Creatinine Clearance: 7 mL/min (A) (by C-G formula based on SCr of 6.26 mg/dL (H)). Liver Function Tests: Recent Labs  Lab 07/13/19 2101 07/14/19 0907 07/14/19 1542 07/15/19 0924 07/15/19 1644 07/15/19 2256 07/16/19 0537  AST 37  --   --   --   --   --   --   ALT 29  --   --   --   --   --   --   ALKPHOS 73  --   --   --   --   --   --   BILITOT 1.5*  --   --   --   --   --   --   PROT 7.0  --   --   --   --   --   --   ALBUMIN 3.7   < > 2.4* 2.6* 3.2* 2.9* 2.7*   < > = values in this interval not displayed.   Recent Labs  Lab 07/13/19 2101  LIPASE 47   No results for input(s): AMMONIA in the last 168 hours. Coagulation profile No results for input(s): INR, PROTIME in the last 168 hours. COVID-19 Labs  Recent Labs    07/14/19 0907  FERRITIN 1,772*    Lab Results  Component Value Date   SARSCOV2NAA NEGATIVE 07/13/2019    CBC: Recent Labs  Lab 07/13/19 2101 07/14/19 0205  WBC 12.9* 12.0*  NEUTROABS 10.8*  --   HGB 12.5 12.2  HCT 38.5 37.0  MCV 60.4* 59.7*  PLT 108* 95*   Cardiac Enzymes: No results for input(s): CKTOTAL, CKMB, CKMBINDEX, TROPONINI  in the last 168 hours. BNP (last 3 results) No results for input(s): PROBNP in the last 8760 hours. CBG: Recent Labs  Lab 07/15/19 0719 07/15/19 1122 07/15/19 1639 07/15/19 2129 07/16/19 0742  GLUCAP 76 113* 195* 115* 83   D-Dimer: No results for input(s): DDIMER in the last 72 hours. Hgb A1c: Recent Labs    07/14/19 0428  HGBA1C 6.2*    Lipid Profile: No results for input(s): CHOL, HDL, LDLCALC, TRIG, CHOLHDL, LDLDIRECT in the last 72 hours. Thyroid function studies: Recent Labs    07/13/19 2230  TSH 1.467   Anemia work up: Recent Labs    07/14/19 0907  VITAMINB12 3,305*  FOLATE >100.0  FERRITIN 1,772*  TIBC 150*  IRON 97   Sepsis Labs: Recent Labs  Lab 07/13/19 2101 07/14/19 0205  WBC 12.9* 12.0*   Microbiology Recent Results (from the past 240 hour(s))  SARS Coronavirus 2 by RT PCR (hospital order, performed in [facility] hospital lab) Nasopharyngeal Nasopharyngeal Swab     Status: None   Collection Time: 07/13/19 10:30 PM   Specimen: Nasopharyngeal Swab  Result Value Ref Range Status   SARS Coronavirus 2 NEGATIVE NEGATIVE Final    Comment: (NOTE) SARS-CoV-2 target nucleic acids are NOT DETECTED.  The SARS-CoV-2 RNA is generally detectable in upper and lower respiratory specimens during the acute phase of infection. The lowest concentration of SARS-CoV-2 viral copies this assay can detect is 250 copies / mL. A negative result does not preclude SARS-CoV-2 infection and should not be used as the sole basis for treatment or other patient management decisions.  A negative result may occur with improper specimen collection / handling, submission of specimen other than nasopharyngeal swab, presence of viral mutation(s) within the areas targeted by this assay, and inadequate number of viral copies (<250 copies / mL). A negative result must be combined with clinical observations, patient history, and epidemiological information.  Fact Sheet for Patients:   StrictlyIdeas.no  Fact Sheet for Healthcare Providers: BankingDealers.co.za  This test is not yet approved or  cleared by the [place] FDA and has been authorized for detection and/or diagnosis of SARS-CoV-2 by FDA under an Emergency Use Authorization (EUA).  This EUA will remain in effect  (meaning this test can be used) for the duration of the COVID-19 declaration under Section 564(b)(1) of the Act, 21 U.S.C. section 360bbb-3(b)(1), unless the authorization is terminated or revoked sooner.  Performed at [facility], [address] [place], [place] [address]   MRSA PCR Screening     Status: None   Collection Time: 07/14/19 12:27 AM  Result Value Ref Range Status   MRSA by PCR NEGATIVE NEGATIVE Final    Comment:        The GeneXpert MRSA Assay (FDA approved for NASAL specimens only), is one component of a comprehensive MRSA colonization surveillance program. It is not intended to diagnose MRSA infection nor to guide or monitor treatment for MRSA infections. Performed at [facility], [place] [address] [place], [place] [address]      Medications:   . aspirin EC  81 mg Oral Daily  . cholecalciferol  1,000 Units Oral Q1400  . clopidogrel  75 mg Oral Daily  . febuxostat  40 mg Oral Q1400  . heparin  5,000 Units Subcutaneous Q8H  . insulin aspart  0-6 Units Subcutaneous TID WC  . insulin aspart  2 Units Subcutaneous TID WC  . insulin detemir  2 Units Subcutaneous QHS  .  sodium bicarbonate  650 mg Oral BID  . sodium chloride flush  3 mL Intravenous Q12H   Continuous Infusions: . sodium chloride 75 mL/hr at 07/16/19 0045      LOS: 2 days   Charlynne Cousins  [facility]  07/16/2019, 10:02 AM

## 2019-07-16 NOTE — Plan of Care (Signed)
  Problem: Education: Goal: Knowledge of General Education information will improve Description: Including pain rating scale, medication(s)/side effects and non-pharmacologic comfort measures Outcome: Progressing   Problem: Clinical Measurements: Goal: Ability to maintain clinical measurements within normal limits will improve Outcome: Progressing Goal: Will remain free from infection Outcome: Progressing Goal: Diagnostic test results will improve Outcome: Progressing Goal: Respiratory complications will improve Outcome: Progressing Goal: Cardiovascular complication will be avoided Outcome: Progressing   Problem: Activity: Goal: Risk for activity intolerance will decrease Outcome: Progressing   Problem: Nutrition: Goal: Adequate nutrition will be maintained Outcome: Progressing   Problem: Coping: Goal: Level of anxiety will decrease Outcome: Progressing   Problem: Elimination: Goal: Will not experience complications related to bowel motility Outcome: Progressing Goal: Will not experience complications related to urinary retention Outcome: Progressing   Problem: Pain Managment: Goal: General experience of comfort will improve Outcome: Progressing   Problem: Safety: Goal: Ability to remain free from injury will improve Outcome: Progressing   Problem: Skin Integrity: Goal: Risk for impaired skin integrity will decrease Outcome: Progressing   Problem: Education: Goal: Knowledge of General Education information will improve Description: Including pain rating scale, medication(s)/side effects and non-pharmacologic comfort measures Outcome: Progressing   Problem: Clinical Measurements: Goal: Ability to maintain clinical measurements within normal limits will improve Outcome: Progressing Goal: Will remain free from infection Outcome: Progressing Goal: Diagnostic test results will improve Outcome: Progressing Goal: Respiratory complications will improve Outcome:  Progressing Goal: Cardiovascular complication will be avoided Outcome: Progressing   Problem: Activity: Goal: Risk for activity intolerance will decrease Outcome: Progressing   Problem: Nutrition: Goal: Adequate nutrition will be maintained Outcome: Progressing   Problem: Coping: Goal: Level of anxiety will decrease Outcome: Progressing   Problem: Elimination: Goal: Will not experience complications related to bowel motility Outcome: Progressing Goal: Will not experience complications related to urinary retention Outcome: Progressing   Problem: Pain Managment: Goal: General experience of comfort will improve Outcome: Progressing   Problem: Safety: Goal: Ability to remain free from injury will improve Outcome: Progressing   Problem: Skin Integrity: Goal: Risk for impaired skin integrity will decrease Outcome: Progressing

## 2019-07-16 NOTE — TOC Initial Note (Signed)
Transition of Care [facility]) - Initial/Assessment Note    Patient Details  Name: Crystal Jacobs MRN: [id number] Date of Birth: Oct 14, 1960  Transition of Care [facility]) CM/SW Contact:    Curlene Labrum, RN Phone Number: 07/16/2019, 2:22 PM  Clinical Narrative:                     Case Management met with the patient and daughter at the bedside.  The patient is [place] from out [place] and is here on a visiting V[place].  Financial counselor was contacted and has spoken with the daughter regarding the travel insurance.  The patient was admitted for weakness and low sodium - Left arm AV fistula present but not accessed.  [facility] called [facility] and followup medical appointment made  - noted in the discharge instructions.    I spoke with [place], [place] with [facility] to get charity [facility] PT for the patient.  Waiting to receive confirmation.   The daughter is going to purchase a rolling walker with 5 in wheels from the [place].  The patient will most probably be discharged tomorrow.  Expected Discharge Plan: [place] Barriers to Discharge: Continued Medical Work up   Patient Goals and CMS Choice Patient states their goals for this hospitalization and ongoing recovery are:: The patient lives with the daughter - is visiting on a travel [place].   Choice offered to / list presented to : [facility] POA / Guardian (daughter, Swathi)  Expected Discharge Plan and Services Expected Discharge Plan: [place] In-house Referral: PCP / Psychologist, educational, Financial Counselor Discharge Planning Services: CM Consult Post Acute Care Choice: Durable Medical Equipment (Daughter is going to pick up a Conservation officer, nature with [place] wheels from [place] today.) Living arrangements for the past 2 months: Single Family Home                 DME Arranged: Walker rolling (Rw to be purchased by daughter through [place] - patient with no SS# to receive charity dme) DME Agency:   Engineer, building services)       [place] Arranged: PT HH Agency: [facility] Date [place] Agency Contacted: 07/16/19 Time [place]: [phone] Representative spoke with at [place]: Waiting for confirmation from [place], [place] for PT  Prior Living Arrangements/Services Living arrangements for the past 2 months: [place] with:: Adult Children Patient language and need for interpreter reviewed:: Yes Do you feel safe going back to the place where you live?: Yes      Need for Family Participation in Patient Care: Yes (Comment) Care giver support system in place?: Yes (comment)   Criminal Activity/Legal Involvement Pertinent to Current Situation/Hospitalization: No - Comment as needed  Activities of Daily Living Home Assistive Devices/Equipment: CBG Meter ADL Screening (condition at time of admission) Patient's cognitive ability adequate to safely complete daily activities?: Yes Is the patient deaf or have difficulty hearing?: No Does the patient have difficulty seeing, even when wearing glasses/contacts?: No Does the patient have difficulty concentrating, remembering, or making decisions?: No Patient able to express need for assistance with ADLs?: Yes Does the patient have difficulty dressing or bathing?: No Independently performs ADLs?: Yes (appropriate for developmental age) Does the patient have difficulty walking or climbing stairs?: No Weakness of Legs: Both Weakness of Arms/Hands: None  Permission Sought/Granted Permission sought to share information with : Case Manager Permission granted to share information with : Yes, Verbal Permission  Granted     Permission granted to share info w AGENCY: Company secretary, [employer] [facility]  Permission granted to share info w Relationship: daughter     Emotional Assessment Appearance:: Appears older than stated age Attitude/Demeanor/Rapport: Gracious Affect (typically observed): Accepting Orientation: : Oriented to  Self, Oriented to Place, Oriented to  Time, Oriented to Situation Alcohol / Substance Use: Not Applicable Psych Involvement: No (comment)  Admission diagnosis:  Acute hyponatremia [E87.1] Patient Active Problem List   Diagnosis Date Noted  . Hypochloremia 07/15/2019  . CKD (chronic kidney disease), stage V ([place]) 07/14/2019  . Microcytic anemia 07/14/2019  . Thrombocytopenia ([place]) 07/14/2019  . Insulin dependent type 2 diabetes mellitus ([place]) 07/14/2019  . Hyponatremia 07/13/2019   PCP:  Patient, No Pcp Per Pharmacy:   [id number] [place] [place] [address]Phone: [phone]: [phone]    Social Determinants of Health (SDOH) Interventions    Readmission Risk Interventions Readmission Risk Prevention Plan 07/16/2019  Transportation Screening Complete  PCP or Specialist Appt within 5-7 Days Complete  Home Care Screening Complete  Medication Review (RN CM) Complete

## 2019-07-16 NOTE — Progress Notes (Signed)
Inpatient Diabetes Program Recommendations  AACE/ADA: New Consensus Statement on Inpatient Glycemic Control (2015)  Target Ranges:  Prepandial:   less than 140 mg/dL      Peak postprandial:   less than 180 mg/dL (1-2 hours)      Critically ill patients:  140 - 180 mg/dL   Lab Results  Component Value Date   GLUCAP 76 07/16/2019   HGBA1C 6.2 (H) 07/14/2019    Review of Glycemic Control Results for MACKENSI, MAHADEO (MRN [id number]) as of 07/16/2019 14:53  Ref. Range 07/15/2019 21:29 07/16/2019 07:42 07/16/2019 11:55 07/16/2019 13:19  Glucose-Capillary Latest Ref Range: 70 - 99 mg/dL 115 (H) 83 61 (L) 76     Inpatient Diabetes Program Recommendations:     Received meal coverage Novolog 2 units last evening and continues to have hypoglycemia.  Please consider discontinuing Novolog 0 units meal coverage TID  Will continue to follow while inpatient.  Thank you, Reche Dixon, RN, BSN Diabetes Coordinator Inpatient Diabetes Program [phone] (team pager from 8a-5p)

## 2019-07-16 NOTE — Progress Notes (Signed)
Physical Therapy Treatment Patient Details Name: Crystal Jacobs MRN: [id number] DOB: 1960-09-06 Today's Date: 07/16/2019    History of Present Illness Pt adm with weakness and found to be hyponatremic and with orthostatic hypotension. PMH - DM, CKD, htn, nephrectomy    PT Comments    Pt denied dizziness with mobility today. Attempted ambulation without assistive device but pt with narrow BOS and ataxic gait pattern with multiple LOB with min/ mod  To correct.  More steady with use of RW but she did run into stationary obstacles in hallway several times with self correction. Changing rec to HHPT and RW at d/c. PT will continue to follow.   Follow Up Recommendations  Home health PT     Equipment Recommendations  Rolling walker with 5" wheels    Recommendations for Other Services       Precautions / Restrictions Precautions Precautions: Other (comment) Precaution Comments: watch orthostatics Restrictions Weight Bearing Restrictions: No Other Position/Activity Restrictions: heavily accented English and she does not understand all phrases, speak slowly    Mobility  Bed Mobility Overal bed mobility: Modified Independent                Transfers Overall transfer level: Needs assistance Equipment used: None;Rolling walker (2 wheeled) Transfers: Sit to/from Stand Sit to Stand: Min assist         General transfer comment: pt with multiple LOB in standing without AD, min A to correct.   Ambulation/Gait Ambulation/Gait assistance: Min assist Gait Distance (Feet): 225 Feet Assistive device: Rolling walker (2 wheeled);None Gait Pattern/deviations: Step-through pattern;Decreased stride length;Ataxic;Narrow base of support Gait velocity: decr Gait velocity interpretation: <1.8 ft/sec, indicate of risk for recurrent falls General Gait Details: had pt attempt ambulation without AD but pt with ataxic gait with narrow base, multiple LOB, pt denied dizziness. Returned to room  and obtained RW and pt then able to ambulate with min-guard A though she ran the RW into numerous obstacles and corrected.    Stairs             Wheelchair Mobility    Modified Rankin (Stroke Patients Only)       Balance Overall balance assessment: Needs assistance Sitting-balance support: No upper extremity supported;Feet supported Sitting balance-Leahy Scale: Normal     Standing balance support: No upper extremity supported Standing balance-Leahy Scale: Poor Standing balance comment: multiple LOB in standing today, increase sway in static stance and pt seems to have no awareness that she is losing balance                            Cognition Arousal/Alertness: Awake/alert Behavior During Therapy: WFL for tasks assessed/performed Overall Cognitive Status: Within Functional Limits for tasks assessed                                        Exercises      General Comments        Pertinent Vitals/Pain Pain Assessment: Faces Faces Pain Scale: No hurt    Home Living                      Prior Function            PT Goals (current goals can now be found in the care plan section) Acute Rehab PT Goals Patient Stated Goal: not stated PT Goal Formulation:  With patient Time For Goal Achievement: 07/29/19 Potential to Achieve Goals: Good Progress towards PT goals: Progressing toward goals    Frequency    Min 3X/week      PT Plan Discharge plan needs to be updated    Co-evaluation              AM-PAC PT "6 Clicks" Mobility   Outcome Measure  Help needed turning from your back to your side while in a flat bed without using bedrails?: None Help needed moving from lying on your back to sitting on the side of a flat bed without using bedrails?: None Help needed moving to and from a bed to a chair (including a wheelchair)?: A Little Help needed standing up from a chair using your arms (e.g., wheelchair or bedside  chair)?: A Little Help needed to walk in hospital room?: A Lot Help needed climbing 3-5 steps with a railing? : A Lot 6 Click Score: 18    End of Session Equipment Utilized During Treatment: Gait belt Activity Tolerance: Patient tolerated treatment well Patient left: with call bell/phone within reach;in bed;with bed alarm set Nurse Communication: Mobility status PT Visit Diagnosis: Muscle weakness (generalized) (M62.81);Other abnormalities of gait and mobility (R26.89)     Time: 5208-0223 PT Time Calculation (min) (ACUTE ONLY): 21 min  Charges:  $Gait Training: 8-22 mins                     Leighton Roach, [place]  Pager [phone] Office [place] 07/16/2019, 1:09 PM

## 2019-07-16 NOTE — Progress Notes (Signed)
[facility] Progress Note    Assessment/ Plan:   1. Hyponatremia - Na+ 106 on admit 6/19 w/ orthostatic symptoms and recent N/V, diarrhea.  Hypovolemic. Na + improving w/ normal saline up to 117 6/20, s/p D5W at 50/hr x 4 hrs and stabilized, then NS resumed at 75/ hr.  Na getting better, 123 this AM.  Will add back Na bicarb, continue sodium chloride for probably 12-24 more hrs.  Orthostatics daily.   2. CKD advanced - had AVF placed in March.  Creat 6.5 > 5.5-- 6.2 here w/ IVF's.  Long discussion with pt and son today.  Hard to assess if she's uremic given low Na.  Will correct, reassess.  Interested in [place] and avoiding dialysis as long as possible but will treat if needed.  Discussed uremic symptoms in detail today.     3. Hypovolemia - improving, orthostatics  4. HyperK- would avoid more K supps in this pt, renal diet, veltassa given  5. IDDM - per primary team  Subjective:    Na up to 123, K 5.9 this AM.  Had received K supps, stopped, Veltassa given.     Objective:   BP (!) 158/63 (BP Location: Right Arm)    Pulse 74    Temp 97.6 F (36.4 C) (Oral)    Resp 16    Ht 5\' 2"  (1.575 m)    Wt 44.2 kg    SpO2 100%    BMI 17.82 kg/m   Intake/Output Summary (Last 24 hours) at 07/16/2019 1054 Last data filed at 07/16/2019 1007 Gross per 24 hour  Intake 2049.43 ml  Output 350 ml  Net 1699.43 ml   Weight change:   Physical Exam: Gen: NAD, lying in bed CVS: RRR Resp: clear  Abd: soft Ext: no LE edema ACCESS: LUE AVF + T/B  Imaging: No results found.  Labs: BMET Recent Labs  Lab 07/14/19 0205 07/14/19 0205 07/14/19 0762 07/14/19 2633 07/14/19 1542 07/14/19 2139 07/15/19 0142 07/15/19 0924 07/15/19 1644 07/15/19 2256 07/16/19 0537  NA 109*   < > 111*   < > 117* 113* 115* 116* 118*   118* 123* 123*  K 3.8  --  3.6  --  3.1*  --   --  3.9 4.4 5.3* 5.9*  CL 70*  --  75*  --  87*  --   --  84* 85* 91* 96*  CO2 21*  --  23  --  19*  --   --  20* 19* 20*  18*  GLUCOSE 78  --  80  --  224*  --   --  89 211* 87 112*  BUN 48*  --  46*  --  42*  --   --  45* 43* 44* 43*  CREATININE 6.57*  --  6.52*  --  5.49*  --   --  6.49* 6.45* 6.39* 6.26*  CALCIUM 9.3  --  8.4*  --  7.4*  --   --  8.5* 8.8* 8.8* 8.6*  PHOS  --   --  4.5  --  3.3  --   --  4.2 3.9 3.8 3.9   < > = values in this interval not displayed.   CBC Recent Labs  Lab 07/13/19 2101 07/14/19 0205  WBC 12.9* 12.0*  NEUTROABS 10.8*  --   HGB 12.5 12.2  HCT 38.5 37.0  MCV 60.4* 59.7*  PLT 108* 95*    Medications:     aspirin EC  81 mg Oral Daily   cholecalciferol  1,000 Units Oral Q1400   clopidogrel  75 mg Oral Daily   febuxostat  40 mg Oral Q1400   heparin  5,000 Units Subcutaneous Q8H   insulin aspart  0-6 Units Subcutaneous TID WC   insulin aspart  2 Units Subcutaneous TID WC   insulin detemir  2 Units Subcutaneous QHS   sodium bicarbonate  650 mg Oral BID   sodium chloride flush  3 mL Intravenous Q12H      Madelon Lips, MD 07/16/2019, 10:54 AM

## 2019-07-16 NOTE — Progress Notes (Signed)
Pt transferred to 5N25 with belongings. Family aware and at bedside.

## 2019-07-16 NOTE — Plan of Care (Signed)

## 2019-07-17 ENCOUNTER — Inpatient Hospital Stay (HOSPITAL_COMMUNITY): Payer: No Typology Code available for payment source

## 2019-07-17 DIAGNOSIS — F419 Anxiety disorder, unspecified: Secondary | ICD-10-CM

## 2019-07-17 DIAGNOSIS — E878 Other disorders of electrolyte and fluid balance, not elsewhere classified: Secondary | ICD-10-CM

## 2019-07-17 DIAGNOSIS — E872 Acidosis: Secondary | ICD-10-CM

## 2019-07-17 DIAGNOSIS — R451 Restlessness and agitation: Secondary | ICD-10-CM

## 2019-07-17 LAB — BLOOD GAS, ARTERIAL
Acid-base deficit: 2.4 mmol/L — ABNORMAL HIGH (ref 0.0–2.0)
Bicarbonate: 21.4 mmol/L (ref 20.0–28.0)
Drawn by: 59101
FIO2: 21
O2 Saturation: 97.2 %
Patient temperature: 37
pCO2 arterial: 33.8 mmHg (ref 32.0–48.0)
pH, Arterial: 7.418 (ref 7.350–7.450)
pO2, Arterial: 92.1 mmHg (ref 83.0–108.0)

## 2019-07-17 LAB — COMPREHENSIVE METABOLIC PANEL WITH GFR
ALT: 22 U/L (ref 0–44)
AST: 40 U/L (ref 15–41)
Albumin: 2.8 g/dL — ABNORMAL LOW (ref 3.5–5.0)
Alkaline Phosphatase: 47 U/L (ref 38–126)
Anion gap: 12 (ref 5–15)
BUN: 39 mg/dL — ABNORMAL HIGH (ref 6–20)
CO2: 17 mmol/L — ABNORMAL LOW (ref 22–32)
Calcium: 8.8 mg/dL — ABNORMAL LOW (ref 8.9–10.3)
Chloride: 100 mmol/L (ref 98–111)
Creatinine, Ser: 5.93 mg/dL — ABNORMAL HIGH (ref 0.44–1.00)
GFR calc Af Amer: 8 mL/min — ABNORMAL LOW
GFR calc non Af Amer: 7 mL/min — ABNORMAL LOW
Glucose, Bld: 152 mg/dL — ABNORMAL HIGH (ref 70–99)
Potassium: 5.5 mmol/L — ABNORMAL HIGH (ref 3.5–5.1)
Sodium: 129 mmol/L — ABNORMAL LOW (ref 135–145)
Total Bilirubin: 1 mg/dL (ref 0.3–1.2)
Total Protein: 5.2 g/dL — ABNORMAL LOW (ref 6.5–8.1)

## 2019-07-17 LAB — CBC
HCT: 34.8 % — ABNORMAL LOW (ref 36.0–46.0)
Hemoglobin: 10.9 g/dL — ABNORMAL LOW (ref 12.0–15.0)
MCH: 19.6 pg — ABNORMAL LOW (ref 26.0–34.0)
MCHC: 31.3 g/dL (ref 30.0–36.0)
MCV: 62.6 fL — ABNORMAL LOW (ref 80.0–100.0)
Platelets: 82 10*3/uL — ABNORMAL LOW (ref 150–400)
RBC: 5.56 MIL/uL — ABNORMAL HIGH (ref 3.87–5.11)
RDW: 17.6 % — ABNORMAL HIGH (ref 11.5–15.5)
WBC: 8.3 10*3/uL (ref 4.0–10.5)
nRBC: 0.5 % — ABNORMAL HIGH (ref 0.0–0.2)

## 2019-07-17 LAB — RENAL FUNCTION PANEL
Albumin: 2.6 g/dL — ABNORMAL LOW (ref 3.5–5.0)
Albumin: 3.3 g/dL — ABNORMAL LOW (ref 3.5–5.0)
Albumin: 3 g/dL — ABNORMAL LOW (ref 3.5–5.0)
Anion gap: 10 (ref 5–15)
Anion gap: 14 (ref 5–15)
Anion gap: 11 (ref 5–15)
BUN: 38 mg/dL — ABNORMAL HIGH (ref 6–20)
BUN: 37 mg/dL — ABNORMAL HIGH (ref 6–20)
BUN: 34 mg/dL — ABNORMAL HIGH (ref 6–20)
CO2: 19 mmol/L — ABNORMAL LOW (ref 22–32)
CO2: 18 mmol/L — ABNORMAL LOW (ref 22–32)
CO2: 20 mmol/L — ABNORMAL LOW (ref 22–32)
Calcium: 8.4 mg/dL — ABNORMAL LOW (ref 8.9–10.3)
Calcium: 9.4 mg/dL (ref 8.9–10.3)
Calcium: 9.2 mg/dL (ref 8.9–10.3)
Chloride: 97 mmol/L — ABNORMAL LOW (ref 98–111)
Chloride: 97 mmol/L — ABNORMAL LOW (ref 98–111)
Chloride: 97 mmol/L — ABNORMAL LOW (ref 98–111)
Creatinine, Ser: 5.9 mg/dL — ABNORMAL HIGH (ref 0.44–1.00)
Creatinine, Ser: 5.91 mg/dL — ABNORMAL HIGH (ref 0.44–1.00)
Creatinine, Ser: 5.87 mg/dL — ABNORMAL HIGH (ref 0.44–1.00)
GFR calc Af Amer: 9 mL/min — ABNORMAL LOW (ref >60)
GFR calc Af Amer: 9 mL/min — ABNORMAL LOW (ref >60)
GFR calc Af Amer: 9 mL/min — ABNORMAL LOW (ref >60)
GFR calc non Af Amer: 7 mL/min — ABNORMAL LOW (ref >60)
GFR calc non Af Amer: 7 mL/min — ABNORMAL LOW (ref >60)
GFR calc non Af Amer: 7 mL/min — ABNORMAL LOW (ref >60)
Glucose, Bld: 84 mg/dL (ref 70–99)
Glucose, Bld: 114 mg/dL — ABNORMAL HIGH (ref 70–99)
Glucose, Bld: 173 mg/dL — ABNORMAL HIGH (ref 70–99)
Phosphorus: 3.1 mg/dL (ref 2.5–4.6)
Phosphorus: 3.4 mg/dL (ref 2.5–4.6)
Phosphorus: 3.7 mg/dL (ref 2.5–4.6)
Potassium: 4.1 mmol/L (ref 3.5–5.1)
Potassium: 4.3 mmol/L (ref 3.5–5.1)
Potassium: 4.5 mmol/L (ref 3.5–5.1)
Sodium: 126 mmol/L — ABNORMAL LOW (ref 135–145)
Sodium: 129 mmol/L — ABNORMAL LOW (ref 135–145)
Sodium: 128 mmol/L — ABNORMAL LOW (ref 135–145)

## 2019-07-17 LAB — GLUCOSE, CAPILLARY
Glucose-Capillary: 83 mg/dL (ref 70–99)
Glucose-Capillary: 119 mg/dL — ABNORMAL HIGH (ref 70–99)
Glucose-Capillary: 165 mg/dL — ABNORMAL HIGH (ref 70–99)
Glucose-Capillary: 115 mg/dL — ABNORMAL HIGH (ref 70–99)
Glucose-Capillary: 202 mg/dL — ABNORMAL HIGH (ref 70–99)

## 2019-07-17 LAB — AMMONIA: Ammonia: 22 umol/L (ref 9–35)

## 2019-07-17 LAB — PHOSPHORUS: Phosphorus: 3.5 mg/dL (ref 2.5–4.6)

## 2019-07-17 MED ORDER — LABETALOL HCL 5 MG/ML IV SOLN
5.0000 mg | Freq: Once | INTRAVENOUS | Status: AC
  Administered 2019-07-17: 5 mg via INTRAVENOUS
  Filled 2019-07-17: qty 4

## 2019-07-17 MED ORDER — PATIROMER SORBITEX CALCIUM 8.4 G PO PACK
16.8000 g | PACK | Freq: Once | ORAL | Status: DC
  Filled 2019-07-17: qty 2

## 2019-07-17 MED ORDER — HALOPERIDOL LACTATE 5 MG/ML IJ SOLN
2.0000 mg | Freq: Three times a day (TID) | INTRAMUSCULAR | Status: DC
  Administered 2019-07-17 – 2019-07-18 (×2): 2 mg via INTRAVENOUS
  Filled 2019-07-17 (×2): qty 1

## 2019-07-17 MED ORDER — SODIUM BICARBONATE 650 MG PO TABS
1300.0000 mg | ORAL_TABLET | Freq: Two times a day (BID) | ORAL | Status: DC
  Administered 2019-07-17 – 2019-07-20 (×6): 1300 mg via ORAL
  Filled 2019-07-17 (×6): qty 2

## 2019-07-17 MED ORDER — SENNA 8.6 MG PO TABS: 1.0000 | ORAL_TABLET | Freq: Every day | ORAL | Status: DC | PRN

## 2019-07-17 MED ORDER — AMLODIPINE BESYLATE 10 MG PO TABS
10.0000 mg | ORAL_TABLET | Freq: Every day | ORAL | Status: DC
  Administered 2019-07-17 – 2019-07-25 (×9): 10 mg via ORAL
  Filled 2019-07-17 (×9): qty 1

## 2019-07-17 MED ORDER — HYDRALAZINE HCL 20 MG/ML IJ SOLN: 5.0000 mg | INTRAMUSCULAR | Status: DC | PRN

## 2019-07-17 MED ORDER — LORAZEPAM 2 MG/ML IJ SOLN: 2.0000 mg | Freq: Once | INTRAMUSCULAR | Status: DC | PRN

## 2019-07-17 MED ORDER — SODIUM CHLORIDE 0.9 % IV SOLN
250.0000 mL | INTRAVENOUS | Status: DC | PRN
  Administered 2019-07-19: 250 mL via INTRAVENOUS

## 2019-07-17 MED ORDER — SODIUM ZIRCONIUM CYCLOSILICATE 10 G PO PACK
10.0000 g | PACK | Freq: Two times a day (BID) | ORAL | Status: AC
  Administered 2019-07-17 (×2): 10 g via ORAL
  Filled 2019-07-17 (×2): qty 1

## 2019-07-17 MED ORDER — SODIUM CHLORIDE 0.9% FLUSH: 3.0000 mL | INTRAVENOUS | Status: DC | PRN

## 2019-07-17 MED ORDER — SODIUM CHLORIDE 0.9% FLUSH
3.0000 mL | Freq: Two times a day (BID) | INTRAVENOUS | Status: DC
  Administered 2019-07-17 – 2019-07-25 (×11): 3 mL via INTRAVENOUS

## 2019-07-17 NOTE — Progress Notes (Signed)
ABG drawn per MD order. Ria in lab notified of ABG being sent down. RT will continue to monitor

## 2019-07-17 NOTE — Progress Notes (Signed)
[facility] Progress Note    Assessment/ Plan:   1. Hyponatremia - Na+ 106 on admit 6/19 w/ orthostatic symptoms and recent N/V, diarrhea.  Hypovolemic. Na + improving w/ normal saline up to 117 6/20, s/p D5W at 50/hr x 4 hrs and stabilized, then NS resumed at 75/ hr.  Na getting better, 123--> 126--> 129 this AM.  IVFs stopped, will see if she can maintain on her own but PO intake poor.  Now that we are fixing electrolyte abnormalities would think she would be getting better if uremia were not at play, and starting dialysis may become necessary during this admission.    2. CKD advanced - had AVF placed in March.  Creat 6.5 > 5.5-- 6.2 here w/ IVF's.  Long discussion with pt and son again today.  Now that we are correcting Na, pt still has poor appetite and + asterixis.  Son has requested that I revisit in the PM after pt has had some food, sat up in the chair, and we've gotten more labs.  Will do so.    Interested in [place] and avoiding dialysis as long as possible but will treat if needed.    3. Hypovolemia - improving, orthostatics + but overall hypertensive, amlodipine restarted, also on metop and minipress at home  4. HyperK- resolved  5. IDDM - per primary team  Subjective:    Na up to 129 this AM.  Per son, she didn't get much sleep last night, didn't eat dinner, not doing well.  + mild asterixis. Discussed uremia and uremic symptoms in detail again--> concerned that her poor appetite and confusion at least in part represent uremia (as well as element of delirium).  BP up, home amlodipine added back this AM.         Objective:   BP (!) 194/80 (BP Location: Right Arm)    Pulse 93    Temp 97.7 F (36.5 C) (Oral)    Resp 16    Ht 5\' 2"  (1.575 m)    Wt 44.2 kg    SpO2 100%    BMI 17.82 kg/m   Intake/Output Summary (Last 24 hours) at 07/17/2019 1138 Last data filed at 07/17/2019 0000 Gross per 24 hour  Intake 1271.62 ml  Output --  Net 1271.62 ml   Weight change:    Physical Exam: Gen: NAD, lying in bed CVS: RRR Resp: clear  Abd: soft Ext: no LE edema ACCESS: LUE AVF + T/B NEURO: AAO but doesn't always make sense, + mild asterixis today  Imaging: No results found.  Labs: BMET Recent Labs  Lab 07/15/19 2256 07/16/19 0537 07/16/19 1028 07/16/19 1444 07/16/19 1805 07/16/19 2309 07/17/19 0516  NA 123* 123* 126* 126* 123* 126* 129*  K 5.3* 5.9* 4.5 4.7 4.4 4.1 4.3  CL 91* 96* 95* 96* 94* 97* 97*  CO2 20* 18* 21* 17* 18* 19* 18*  GLUCOSE 87 112* 64* 182* 130* 84 114*  BUN 44* 43* 39* 40* 39* 38* 37*  CREATININE 6.39* 6.26* 5.99* 6.09* 6.03* 5.90* 5.91*  CALCIUM 8.8* 8.6* 8.8* 9.0 8.9 8.4* 9.4  PHOS 3.8 3.9 3.5 3.4 3.4 3.1 3.4   CBC Recent Labs  Lab 07/13/19 2101 07/14/19 0205 07/17/19 0516  WBC 12.9* 12.0* 8.3  NEUTROABS 10.8*  --   --   HGB 12.5 12.2 10.9*  HCT 38.5 37.0 34.8*  MCV 60.4* 59.7* 62.6*  PLT 108* 95* 82*    Medications:     amLODipine  10  mg Oral Daily   aspirin EC  81 mg Oral Daily   cholecalciferol  1,000 Units Oral Q1400   clopidogrel  75 mg Oral Daily   febuxostat  40 mg Oral Q1400   heparin  5,000 Units Subcutaneous Q8H   insulin aspart  0-6 Units Subcutaneous TID WC   insulin aspart  2 Units Subcutaneous TID WC   insulin detemir  2 Units Subcutaneous QHS   sodium bicarbonate  1,300 mg Oral BID   sodium chloride flush  3 mL Intravenous Q12H      Madelon Lips, MD 07/17/2019, 11:38 AM

## 2019-07-17 NOTE — Progress Notes (Signed)
CCMD noted with patient elevated pulse at 123. Patient with yellow mews at this time. Provider Sharlet Salina notified and Rapid Response Nurse Shanon Brow notified. See Mar for new orders. Charge Nurse Blanch Media updated.

## 2019-07-17 NOTE — Plan of Care (Signed)

## 2019-07-17 NOTE — Progress Notes (Addendum)
   07/17/19 0645  Assess: MEWS Score  Temp 97.7 F (36.5 C)  BP (!) 194/80  Pulse Rate 93  Resp 16  Level of Consciousness Alert  SpO2 100 %  O2 Device Room Air  Patient Activity (if Appropriate) In bed  Assess: MEWS Score  MEWS Temp 0  MEWS Systolic 0  MEWS Pulse 0  MEWS RR 0  MEWS LOC 0  MEWS Score 0  MEWS Score Color Green  Assess: if the MEWS score is Yellow or Red  Were vital signs taken at a resting state? Yes  Focused Assessment Documented focused assessment  Early Detection of Sepsis Score *See Row Information* Low  MEWS guidelines implemented *See Row Information* No, previously yellow, continue vital signs every 4 hours  Treat  MEWS Interventions Other (Comment) (noted provider with elevated B/P at 194/80)  Take Vital Signs  Increase Vital Sign Frequency  Yellow: Q 2hr X 2 then Q 4hr X 2, if remains yellow, continue Q 4hrs  Escalate  MEWS: Escalate Yellow: discuss with charge nurse/RN and consider discussing with provider and RRT  Notify: Charge Nurse/RN  Name of Charge Nurse/RN Notified Blanch Media  Date Charge Nurse/RN Notified 07/17/19  Time Charge Nurse/RN Notified 3154  Notify: Provider  Provider Name/Title [facility]  Date Provider Notified 07/17/19  Time Provider Notified 0706  Notification Type Page  Notification Reason Other (Comment) (continued elevated B/P)  Response See new orders (awaiting call back)  Date of Provider Response 07/17/19  Time of Provider Response 0719  Document  Patient Outcome Other (Comment) (f/u B/P at 194/80. text/and call out to Covering MD([place]))  Progress note created (see row info) Yes  Next follow up vitals due at 08:44am. Day shift Nurse made aware and sign on door.

## 2019-07-17 NOTE — Progress Notes (Signed)
PROGRESS NOTE    [facility]  QZR:[id number] DOB: [birth year] DOA: 07/13/2019 PCP: Patient, No Pcp Per     Brief Narrative:  60 y.o. female from [place] PMHx DM type II uncontrolled with complication, HTN, CKD stage V, s/p Nephrectomy who presented to [place] ED for evaluation of generalized weakness.  History is supplemented by daughter at bedside.  About 2 weeks ago patient began to feel generally weak.  She had been having constipation and about 1 week ago she took Dulcolax.  The following day she had 9 bowel movements which have reportedly normalized since then.  She has however been feeling very weak and unable to perform the usual amount of activity she is used to.  She says she has had poor appetite and low oral intake.  She has had some nausea and vomiting.  She says she has a history of CKD stage V weight loss reported creatinine 7.7 in April 2021.  She says she is makes a small amount of urine without dysuria.  She says she usually gets injections to keep her blood counts up.  She denies any obvious bleeding.  She says she now again has decreased bowel movements than expected.  She is visiting from [place] and arrived 1 month ago.  She has been on a low-sodium diet.  She has a medication list which reads as follows:  Lantus 8 units once a day Novorapid 6 units daily Pantoprazole Multivitamin Minipress XL 5 mg twice daily Sodium bicarb 500 twice daily Febuxostat40 mg daily for gout Clopitab (Plavix/ASA 75/10) daily Cilnidipine 10/50 9 am and 9 pm   Subjective: Awake, speaking gibberish per daughter who is at bedside.  Does not follow commands.   Assessment & Plan: Covid vaccination; +1/2 vaccination    Principal Problem:   Hyponatremia Active Problems:   CKD (chronic kidney disease), stage V (HCC)   Microcytic anemia   Thrombocytopenia (HCC)   Insulin dependent type 2 diabetes mellitus (HCC)   Hypochloremia  Acute altered mental status -Per  daughter even though sodium has mostly corrected patient's mental status has worsened in the past 24 to 48 hours.  States mother is talking gibberish unable to answer question coherently.  This a.m. combative unable to coherently answer questions. -CT head pending -Ammonia pending -Pan culture pending (patient has been hypothermic) -ABG does not account for patient's altered mental status. -Decrease sedating medication, after patient obtains head CT.  Severe hypovolemic Hyponatremia/Hypochloremia: -Hold all patient's sodium has improved mental status has not in fact has worsened.   Recent Labs  Lab 07/16/19 1805 07/16/19 2309 07/17/19 0516 07/17/19 1352 07/17/19 1731  NA 123* 126* 129* 129* 128*  -Per daughter who has been at her bedside patient's mental status has continued to deteriorate in the last 48 to 24 hours.  Essential HTN -6/23 Amlodipine 10 mg daily -6/23 Hydralazine PRN -We will allow some permissive HTN until we are certain patient has not had a stroke  CKD (chronic kidney disease), stage V ([place]) Patient has a fistula in the left arm with a good thrill, the daughter relates that her creatinine usually ranges around 7. Nephrology's assistance. No emergent need for hemodialysis.  Chronic thrombocytopenia: -At home she takes aspirin and Plavix. -Thrombocytopenia continues to worsen.  Although may be reactive could also be secondary to heparin. Will discontinue heparin -6/23 HIT panel pending -If nephrology decides on CRRT in the a.m. we will need to restart an anticoagulant.  Microcytic anemia anemia of chronic renal disease:  She states she has been receiving erythropoietin as an outpatient. Anemia panel is pending, her current hemoglobin is 12.5. Recent Labs  Lab 07/13/19 2101 07/14/19 0205 07/17/19 0516  HGB 12.5 12.2 10.9*  -Hemoccult pending  DM type II controlled with complication -7/82 hemoglobin A1c = 6.2  -Levemir 2 units daily -NovoLog 2 units  qac  Agitation/Anxiety -Haldol 2 mg TID  Metabolic acidosis? -ABG supports metabolic acidosis  Hypokalemia/Hyperkalemia: -Lokelma 10 g x 2  Underweight: Continue her renal diet.    DVT prophylaxis: SCD Code Status: Full Family Communication: 6/23 spoke with daughter, son in person and several relatives via phone conference Status is: Inpatient    Dispo: The patient is from: Home              Anticipated d/c is to: Home              Anticipated d/c date is: 6/30              Patient currently unstable      Consultants:    Procedures/Significant Events:    I have personally reviewed and interpreted all radiology studies and my findings are as above.  VENTILATOR SETTINGS:    Cultures 6/19 SARS coronavirus negative  Antimicrobials: Anti-infectives (From admission, onward)   None       Devices    LINES / TUBES:      Continuous Infusions: . sodium chloride 50 mL/hr at 07/17/19 0000     Objective: Vitals:   07/17/19 0340 07/17/19 0444 07/17/19 0531 07/17/19 0645  BP: (!) 185/72 (!) 192/85  (!) 194/80  Pulse: 77 (!) 123  93  Resp: 18 17 17 16   Temp: (!) 97.4 F (36.3 C) (!) 97.4 F (36.3 C)  97.7 F (36.5 C)  TempSrc: Oral Oral  Oral  SpO2: 100% 100%  100%  Weight:      Height:        Intake/Output Summary (Last 24 hours) at 07/17/2019 9562 Last data filed at 07/17/2019 0000 Gross per 24 hour  Intake 3321.05 ml  Output --  Net 3321.05 ml   Filed Weights   07/14/19 0047 07/14/19 0500 07/15/19 0357  Weight: 43.8 kg 43.8 kg 44.2 kg    Examination:  General: Alert, speaking gibberish, not following commands no acute respiratory distress Eyes: negative scleral hemorrhage, negative anisocoria, negative icterus ENT: Negative Runny nose, negative gingival bleeding, Neck:  Negative scars, masses, torticollis, lymphadenopathy, JVD Lungs: Clear to auscultation bilaterally without wheezes or crackles Cardiovascular: Regular rate and  rhythm without murmur gallop or rub normal S1 and S2 Abdomen: negative abdominal pain, nondistended, positive soft, bowel sounds, no rebound, no ascites, no appreciable mass Extremities: No significant cyanosis, clubbing, or edema bilateral lower extremities Skin: Negative rashes, lesions, ulcers Psychiatric: Unable to evaluate secondary to altered mental status Central nervous system: Unable to evaluate secondary to altered mental status  .     Data Reviewed: Care during the described time interval was provided by me .  I have reviewed this patient's available data, including medical history, events of note, physical examination, and all test results as part of my evaluation.  CBC: Recent Labs  Lab 07/13/19 2101 07/14/19 0205 07/17/19 0516  WBC 12.9* 12.0* 8.3  NEUTROABS 10.8*  --   --   HGB 12.5 12.2 10.9*  HCT 38.5 37.0 34.8*  MCV 60.4* 59.7* 62.6*  PLT 108* 95* 82*   Basic Metabolic Panel: Recent Labs  Lab 07/16/19 1028 07/16/19 1444 07/16/19 1805  07/16/19 2309 07/17/19 0516  NA 126* 126* 123* 126* 129*  K 4.5 4.7 4.4 4.1 4.3  CL 95* 96* 94* 97* 97*  CO2 21* 17* 18* 19* 18*  GLUCOSE 64* 182* 130* 84 114*  BUN 39* 40* 39* 38* 37*  CREATININE 5.99* 6.09* 6.03* 5.90* 5.91*  CALCIUM 8.8* 9.0 8.9 8.4* 9.4  PHOS 3.5 3.4 3.4 3.1 3.4   GFR: Estimated Creatinine Clearance: 7.4 mL/min (A) (by C-G formula based on SCr of 5.91 mg/dL (H)). Liver Function Tests: Recent Labs  Lab 07/13/19 2101 07/14/19 0907 07/16/19 1028 07/16/19 1444 07/16/19 1805 07/16/19 2309 07/17/19 0516  AST 37  --   --   --   --   --   --   ALT 29  --   --   --   --   --   --   ALKPHOS 73  --   --   --   --   --   --   BILITOT 1.5*  --   --   --   --   --   --   PROT 7.0  --   --   --   --   --   --   ALBUMIN 3.7   < > 2.9* 2.7* 2.9* 2.6* 3.3*   < > = values in this interval not displayed.   Recent Labs  Lab 07/13/19 2101  LIPASE 47   No results for input(s): AMMONIA in the last 168  hours. Coagulation Profile: No results for input(s): INR, PROTIME in the last 168 hours. Cardiac Enzymes: No results for input(s): CKTOTAL, CKMB, CKMBINDEX, TROPONINI in the last 168 hours. BNP (last 3 results) No results for input(s): PROBNP in the last 8760 hours. HbA1C: No results for input(s): HGBA1C in the last 72 hours. CBG: Recent Labs  Lab 07/16/19 1319 07/16/19 1620 07/16/19 1949 07/16/19 2145 07/17/19 0433  GLUCAP 76 154* 75 132* 83   Lipid Profile: No results for input(s): CHOL, HDL, LDLCALC, TRIG, CHOLHDL, LDLDIRECT in the last 72 hours. Thyroid Function Tests: No results for input(s): TSH, T4TOTAL, FREET4, T3FREE, THYROIDAB in the last 72 hours. Anemia Panel: Recent Labs    07/14/19 0907  VITAMINB12 3,305*  FOLATE >100.0  FERRITIN 1,772*  TIBC 150*  IRON 97   Sepsis Labs: No results for input(s): PROCALCITON, LATICACIDVEN in the last 168 hours.  Recent Results (from the past 240 hour(s))  SARS Coronavirus 2 by RT PCR (hospital order, performed in [facility] hospital lab) Nasopharyngeal Nasopharyngeal Swab     Status: None   Collection Time: 07/13/19 10:30 PM   Specimen: Nasopharyngeal Swab  Result Value Ref Range Status   SARS Coronavirus 2 NEGATIVE NEGATIVE Final    Comment: (NOTE) SARS-CoV-2 target nucleic acids are NOT DETECTED.  The SARS-CoV-2 RNA is generally detectable in upper and lower respiratory specimens during the acute phase of infection. The lowest concentration of SARS-CoV-2 viral copies this assay can detect is 250 copies / mL. A negative result does not preclude SARS-CoV-2 infection and should not be used as the sole basis for treatment or other patient management decisions.  A negative result may occur with improper specimen collection / handling, submission of specimen other than nasopharyngeal swab, presence of viral mutation(s) within the areas targeted by this assay, and inadequate number of viral copies (<250 copies / mL). A  negative result must be combined with clinical observations, patient history, and epidemiological information.  Fact Sheet for Patients:  StrictlyIdeas.no  Fact Sheet for Healthcare Providers: BankingDealers.co.za  This test is not yet approved or  cleared by the [place] FDA and has been authorized for detection and/or diagnosis of SARS-CoV-2 by FDA under an Emergency Use Authorization (EUA).  This EUA will remain in effect (meaning this test can be used) for the duration of the COVID-19 declaration under Section 564(b)(1) of the Act, 21 U.S.C. section 360bbb-3(b)(1), unless the authorization is terminated or revoked sooner.  Performed at [facility], [address] [place], [place] [address]   MRSA PCR Screening     Status: None   Collection Time: 07/14/19 12:27 AM  Result Value Ref Range Status   MRSA by PCR NEGATIVE NEGATIVE Final    Comment:        The GeneXpert MRSA Assay (FDA approved for NASAL specimens only), is one component of a comprehensive MRSA colonization surveillance program. It is not intended to diagnose MRSA infection nor to guide or monitor treatment for MRSA infections. Performed at [facility], [place] [address], [place], [place] [address]          Radiology Studies: No results found.      Scheduled Meds: . aspirin EC  81 mg Oral Daily  . cholecalciferol  1,000 Units Oral Q1400  . clopidogrel  75 mg Oral Daily  . febuxostat  40 mg Oral Q1400  . heparin  5,000 Units Subcutaneous Q8H  . insulin aspart  0-6 Units Subcutaneous TID WC  . insulin aspart  2 Units Subcutaneous TID WC  . insulin detemir  2 Units Subcutaneous QHS  . sodium bicarbonate  650 mg Oral BID  . sodium chloride flush  3 mL Intravenous Q12H   Continuous Infusions: . sodium chloride 50 mL/hr at 07/17/19 0000     LOS: 3 days    Time spent:40 min    Damisha Wolff, Geraldo Docker, MD [facility] Pager [phone]  If 7PM-7AM, please contact night-coverage [email or web address] Password [facility] 07/17/2019, 7:14 AM

## 2019-07-17 NOTE — Progress Notes (Signed)
Follow up B/P with continued elevation at 194/80.  Text out to covering provider. Patient continued on Yellow Mews.  Day Shift Nurse Maggie updated with the above MEWS concerns.  Dr. Sherral Hammers with call back and new orders. See orders and Mars.  Son and daughter at the bedside made aware current findings and plan of care for patient.  Bed alarm maintained and call light and phone in reach.

## 2019-07-17 NOTE — Progress Notes (Signed)
   07/17/19 0444  Vitals  Temp (!) 97.4 F (36.3 C)  Temp Source Oral  BP (!) 192/85 (nurse aware)  BP Location Right Arm  BP Method Automatic  Patient Position (if appropriate) Lying  Pulse Rate (!) 123 (nurse aware)  Pulse Rate Source Dinamap  Resp 17  Level of Consciousness  Level of Consciousness Alert  Oxygen Therapy  SpO2 100 %  O2 Device Room Air  Pain Assessment  Pain Scale 0-10  Pain Score 0  POSS Scale ([place] Opioid Sedation Scale)  POSS *See Group Information* 1-Acceptable,Awake and alert  Glasgow Coma Scale  Eye Opening 4  Best Verbal Response (NON-intubated) 5  Best Motor Response 6  [place]asgow Coma Scale Score 15  MEWS Score  MEWS Temp 0  MEWS Systolic 0  MEWS Pulse 2  MEWS RR 0  MEWS LOC 0  MEWS Score 2  MEWS Score Color Yellow  Provider Notification  Provider Name/Title Sharlet Salina  Date Provider Notified 07/17/19  Time Provider Notified [phone]  Notification Type Page  Notification Reason Change in status;Requested by patient/family  Response Other (Comment) (awaiting response)  Date of Provider Response 07/17/19  Time of Provider Response 0500  Rapid Response Notification  Name of Rapid Response RN Notified Shanon Brow  Date Rapid Response Notified 07/17/19  Time Rapid Response Notified 5848  Note  Observations Charge Nurse Updated

## 2019-07-17 NOTE — Progress Notes (Addendum)
-   visited pt in room again this PM - pt is sleeping and awakens to light touch, voices no complaints - dtr Swati here with her and other relatives on conference call - continues to have poor PO intake - discussed likely uremia, explained complications of uremic toxins--> if all her problems were related to the hyponatremia alone they should all be getting better by now  - discussed interplay of delirium and uremia - family wants to hold off on dialysis as long as possible but we will need to start RRT in the next 24 hours if we are to avoid a poor outcome - no gross focal neuro deficits, will defer head imaging to discretion of primary - reports of continued constipation- will order AXR - all questions answered  Madelon Lips MD [place] pgr [phone]

## 2019-07-18 LAB — RENAL FUNCTION PANEL
Albumin: 3.1 g/dL — ABNORMAL LOW (ref 3.5–5.0)
Anion gap: 11 (ref 5–15)
BUN: 37 mg/dL — ABNORMAL HIGH (ref 6–20)
CO2: 22 mmol/L (ref 22–32)
Calcium: 9.6 mg/dL (ref 8.9–10.3)
Chloride: 97 mmol/L — ABNORMAL LOW (ref 98–111)
Creatinine, Ser: 6.06 mg/dL — ABNORMAL HIGH (ref 0.44–1.00)
GFR calc Af Amer: 8 mL/min — ABNORMAL LOW (ref >60)
GFR calc non Af Amer: 7 mL/min — ABNORMAL LOW (ref >60)
Glucose, Bld: 206 mg/dL — ABNORMAL HIGH (ref 70–99)
Phosphorus: 3.8 mg/dL (ref 2.5–4.6)
Potassium: 5 mmol/L (ref 3.5–5.1)
Sodium: 130 mmol/L — ABNORMAL LOW (ref 135–145)

## 2019-07-18 LAB — COMPREHENSIVE METABOLIC PANEL
ALT: 18 U/L (ref 0–44)
AST: 40 U/L (ref 15–41)
Albumin: 2.7 g/dL — ABNORMAL LOW (ref 3.5–5.0)
Alkaline Phosphatase: 45 U/L (ref 38–126)
Anion gap: 9 (ref 5–15)
BUN: 37 mg/dL — ABNORMAL HIGH (ref 6–20)
CO2: 21 mmol/L — ABNORMAL LOW (ref 22–32)
Calcium: 8.9 mg/dL (ref 8.9–10.3)
Chloride: 98 mmol/L (ref 98–111)
Creatinine, Ser: 6.02 mg/dL — ABNORMAL HIGH (ref 0.44–1.00)
GFR calc Af Amer: 8 mL/min — ABNORMAL LOW (ref >60)
GFR calc non Af Amer: 7 mL/min — ABNORMAL LOW (ref >60)
Glucose, Bld: 143 mg/dL — ABNORMAL HIGH (ref 70–99)
Potassium: 4.6 mmol/L (ref 3.5–5.1)
Sodium: 128 mmol/L — ABNORMAL LOW (ref 135–145)
Total Bilirubin: 0.8 mg/dL (ref 0.3–1.2)
Total Protein: 5.1 g/dL — ABNORMAL LOW (ref 6.5–8.1)

## 2019-07-18 LAB — CBC WITH DIFFERENTIAL/PLATELET
Abs Immature Granulocytes: 0.1 10*3/uL — ABNORMAL HIGH (ref 0.00–0.07)
Basophils Absolute: 0.1 10*3/uL (ref 0.0–0.1)
Basophils Relative: 1 %
Eosinophils Absolute: 0 10*3/uL (ref 0.0–0.5)
Eosinophils Relative: 0 %
HCT: 27.8 % — ABNORMAL LOW (ref 36.0–46.0)
Hemoglobin: 8.8 g/dL — ABNORMAL LOW (ref 12.0–15.0)
Immature Granulocytes: 1 %
Lymphocytes Relative: 8 %
Lymphs Abs: 0.6 10*3/uL — ABNORMAL LOW (ref 0.7–4.0)
MCH: 19.7 pg — ABNORMAL LOW (ref 26.0–34.0)
MCHC: 31.7 g/dL (ref 30.0–36.0)
MCV: 62.2 fL — ABNORMAL LOW (ref 80.0–100.0)
Monocytes Absolute: 0.5 10*3/uL (ref 0.1–1.0)
Monocytes Relative: 6 %
Neutro Abs: 6.9 10*3/uL (ref 1.7–7.7)
Neutrophils Relative %: 84 %
Platelets: 83 10*3/uL — ABNORMAL LOW (ref 150–400)
RBC: 4.47 MIL/uL (ref 3.87–5.11)
RDW: 16.6 % — ABNORMAL HIGH (ref 11.5–15.5)
WBC: 8.2 10*3/uL (ref 4.0–10.5)
nRBC: 0.7 % — ABNORMAL HIGH (ref 0.0–0.2)

## 2019-07-18 LAB — URINALYSIS, ROUTINE W REFLEX MICROSCOPIC
Bilirubin Urine: NEGATIVE
Glucose, UA: 150 mg/dL — AB
Hgb urine dipstick: NEGATIVE
Ketones, ur: NEGATIVE mg/dL
Leukocytes,Ua: NEGATIVE
Nitrite: NEGATIVE
Protein, ur: 100 mg/dL — AB
Specific Gravity, Urine: 1.008 (ref 1.005–1.030)
pH: 7 (ref 5.0–8.0)

## 2019-07-18 LAB — FERRITIN: Ferritin: 1582 ng/mL — ABNORMAL HIGH (ref 11–307)

## 2019-07-18 LAB — IRON AND TIBC
Iron: 142 ug/dL (ref 28–170)
Saturation Ratios: 91 % — ABNORMAL HIGH (ref 10.4–31.8)
TIBC: 157 ug/dL — ABNORMAL LOW (ref 250–450)
UIBC: 15 ug/dL

## 2019-07-18 LAB — MAGNESIUM: Magnesium: 2.2 mg/dL (ref 1.7–2.4)

## 2019-07-18 LAB — HEPATITIS B SURFACE ANTIGEN: Hepatitis B Surface Ag: REACTIVE — AB

## 2019-07-18 LAB — GLUCOSE, CAPILLARY
Glucose-Capillary: 87 mg/dL (ref 70–99)
Glucose-Capillary: 199 mg/dL — ABNORMAL HIGH (ref 70–99)
Glucose-Capillary: 123 mg/dL — ABNORMAL HIGH (ref 70–99)
Glucose-Capillary: 217 mg/dL — ABNORMAL HIGH (ref 70–99)

## 2019-07-18 LAB — PHOSPHORUS: Phosphorus: 3.9 mg/dL (ref 2.5–4.6)

## 2019-07-18 MED ORDER — SODIUM CHLORIDE 0.9 % IV SOLN
1.0000 g | INTRAVENOUS | Status: DC
  Administered 2019-07-18 – 2019-07-20 (×3): 1 g via INTRAVENOUS
  Filled 2019-07-18 (×3): qty 10

## 2019-07-18 MED ORDER — SODIUM CHLORIDE 0.9 % IV SOLN: 100.0000 mL | INTRAVENOUS | Status: DC | PRN

## 2019-07-18 MED ORDER — DARBEPOETIN ALFA 60 MCG/0.3ML IJ SOSY
60.0000 ug | PREFILLED_SYRINGE | INTRAMUSCULAR | Status: DC
  Administered 2019-07-18: 60 ug via SUBCUTANEOUS
  Filled 2019-07-18: qty 0.3

## 2019-07-18 MED ORDER — LIDOCAINE HCL (PF) 1 % IJ SOLN: 5.0000 mL | INTRAMUSCULAR | Status: DC | PRN

## 2019-07-18 MED ORDER — HALOPERIDOL LACTATE 5 MG/ML IJ SOLN: 1.0000 mg | Freq: Two times a day (BID) | INTRAMUSCULAR | Status: DC

## 2019-07-18 MED ORDER — PENTAFLUOROPROP-TETRAFLUOROETH EX AERO: 1.0000 "application " | INHALATION_SPRAY | CUTANEOUS | Status: DC | PRN

## 2019-07-18 MED ORDER — LIDOCAINE-PRILOCAINE 2.5-2.5 % EX CREA
1.0000 | TOPICAL_CREAM | CUTANEOUS | Status: DC | PRN
  Filled 2019-07-18: qty 5

## 2019-07-18 MED ORDER — HALOPERIDOL LACTATE 5 MG/ML IJ SOLN
1.0000 mg | Freq: Two times a day (BID) | INTRAMUSCULAR | Status: DC | PRN
  Filled 2019-07-18: qty 0.2

## 2019-07-18 MED ORDER — SODIUM CHLORIDE 0.9 % IV SOLN: INTRAVENOUS | Status: DC

## 2019-07-18 MED ORDER — NEPRO/CARBSTEADY PO LIQD
237.0000 mL | Freq: Two times a day (BID) | ORAL | Status: DC
  Administered 2019-07-21 – 2019-07-25 (×5): 237 mL via ORAL

## 2019-07-18 MED ORDER — RENA-VITE PO TABS
1.0000 | ORAL_TABLET | Freq: Every day | ORAL | Status: DC
  Administered 2019-07-18 – 2019-07-24 (×6): 1 via ORAL
  Filled 2019-07-18 (×8): qty 1

## 2019-07-18 MED ORDER — CHLORHEXIDINE GLUCONATE CLOTH 2 % EX PADS
6.0000 | MEDICATED_PAD | Freq: Every day | CUTANEOUS | Status: DC
  Administered 2019-07-19 – 2019-07-22 (×4): 6 via TOPICAL

## 2019-07-18 NOTE — Plan of Care (Signed)
  Problem: Education: Goal: Knowledge of General Education information will improve Description: Including pain rating scale, medication(s)/side effects and non-pharmacologic comfort measures 07/18/2019 0337 by Josepha Pigg, RN Outcome: Progressing 07/18/2019 0336 by Josepha Pigg, RN Outcome: Progressing 07/18/2019 0336 by Josepha Pigg, RN Outcome: Progressing   Problem: Clinical Measurements: Goal: Ability to maintain clinical measurements within normal limits will improve 07/18/2019 0337 by Josepha Pigg, RN Outcome: Progressing 07/18/2019 0336 by Josepha Pigg, RN Outcome: Progressing 07/18/2019 0336 by Josepha Pigg, RN Outcome: Progressing Goal: Will remain free from infection 07/18/2019 0337 by Josepha Pigg, RN Outcome: Progressing 07/18/2019 0336 by Josepha Pigg, RN Outcome: Progressing 07/18/2019 0336 by Josepha Pigg, RN Outcome: Progressing Goal: Diagnostic test results will improve 07/18/2019 0337 by Josepha Pigg, RN Outcome: Progressing 07/18/2019 0336 by Josepha Pigg, RN Outcome: Progressing 07/18/2019 0336 by Josepha Pigg, RN Outcome: Progressing Goal: Respiratory complications will improve 07/18/2019 0337 by Josepha Pigg, RN Outcome: Progressing 07/18/2019 0336 by Josepha Pigg, RN Outcome: Progressing 07/18/2019 0336 by Josepha Pigg, RN Outcome: Progressing Goal: Cardiovascular complication will be avoided 07/18/2019 0337 by Josepha Pigg, RN Outcome: Progressing 07/18/2019 0336 by Josepha Pigg, RN Outcome: Progressing 07/18/2019 0336 by Josepha Pigg, RN Outcome: Progressing   Problem: Activity: Goal: Risk for activity intolerance will decrease 07/18/2019 0337 by Josepha Pigg, RN Outcome: Progressing 07/18/2019 0336 by Josepha Pigg, RN Outcome: Progressing 07/18/2019 0336 by Josepha Pigg, RN Outcome: Progressing   Problem: Nutrition: Goal:  Adequate nutrition will be maintained 07/18/2019 0337 by Josepha Pigg, RN Outcome: Progressing 07/18/2019 0336 by Josepha Pigg, RN Outcome: Progressing 07/18/2019 0336 by Josepha Pigg, RN Outcome: Progressing   Problem: Coping: Goal: Level of anxiety will decrease 07/18/2019 0337 by Josepha Pigg, RN Outcome: Progressing 07/18/2019 0336 by Josepha Pigg, RN Outcome: Progressing 07/18/2019 0336 by Josepha Pigg, RN Outcome: Progressing   Problem: Elimination: Goal: Will not experience complications related to bowel motility 07/18/2019 0337 by Josepha Pigg, RN Outcome: Progressing 07/18/2019 0336 by Josepha Pigg, RN Outcome: Progressing 07/18/2019 0336 by Josepha Pigg, RN Outcome: Progressing Goal: Will not experience complications related to urinary retention 07/18/2019 0337 by Josepha Pigg, RN Outcome: Progressing 07/18/2019 0336 by Josepha Pigg, RN Outcome: Progressing 07/18/2019 0336 by Josepha Pigg, RN Outcome: Progressing   Problem: Pain Managment: Goal: General experience of comfort will improve 07/18/2019 0337 by Josepha Pigg, RN Outcome: Progressing 07/18/2019 0336 by Josepha Pigg, RN Outcome: Progressing 07/18/2019 0336 by Josepha Pigg, RN Outcome: Progressing   Problem: Safety: Goal: Ability to remain free from injury will improve 07/18/2019 0337 by Josepha Pigg, RN Outcome: Progressing 07/18/2019 0336 by Josepha Pigg, RN Outcome: Progressing 07/18/2019 0336 by Josepha Pigg, RN Outcome: Progressing   Problem: Skin Integrity: Goal: Risk for impaired skin integrity will decrease 07/18/2019 0337 by Josepha Pigg, RN Outcome: Progressing 07/18/2019 0336 by Josepha Pigg, RN Outcome: Progressing 07/18/2019 0336 by Josepha Pigg, RN Outcome: Progressing

## 2019-07-18 NOTE — Plan of Care (Deleted)

## 2019-07-18 NOTE — Plan of Care (Signed)
  Problem: Education: Goal: Knowledge of General Education information will improve Description: Including pain rating scale, medication(s)/side effects and non-pharmacologic comfort measures 07/18/2019 0336 by Josepha Pigg, RN Outcome: Progressing 07/18/2019 0336 by Josepha Pigg, RN Outcome: Progressing   Problem: Clinical Measurements: Goal: Ability to maintain clinical measurements within normal limits will improve 07/18/2019 0336 by Josepha Pigg, RN Outcome: Progressing 07/18/2019 0336 by Josepha Pigg, RN Outcome: Progressing Goal: Will remain free from infection 07/18/2019 0336 by Josepha Pigg, RN Outcome: Progressing 07/18/2019 0336 by Josepha Pigg, RN Outcome: Progressing Goal: Diagnostic test results will improve 07/18/2019 0336 by Josepha Pigg, RN Outcome: Progressing 07/18/2019 0336 by Josepha Pigg, RN Outcome: Progressing Goal: Respiratory complications will improve 07/18/2019 0336 by Josepha Pigg, RN Outcome: Progressing 07/18/2019 0336 by Josepha Pigg, RN Outcome: Progressing Goal: Cardiovascular complication will be avoided 07/18/2019 0336 by Josepha Pigg, RN Outcome: Progressing 07/18/2019 0336 by Josepha Pigg, RN Outcome: Progressing   Problem: Activity: Goal: Risk for activity intolerance will decrease 07/18/2019 0336 by Josepha Pigg, RN Outcome: Progressing 07/18/2019 0336 by Josepha Pigg, RN Outcome: Progressing   Problem: Nutrition: Goal: Adequate nutrition will be maintained 07/18/2019 0336 by Josepha Pigg, RN Outcome: Progressing 07/18/2019 0336 by Josepha Pigg, RN Outcome: Progressing   Problem: Coping: Goal: Level of anxiety will decrease 07/18/2019 0336 by Josepha Pigg, RN Outcome: Progressing 07/18/2019 0336 by Josepha Pigg, RN Outcome: Progressing   Problem: Elimination: Goal: Will not experience complications related to bowel  motility 07/18/2019 0336 by Josepha Pigg, RN Outcome: Progressing 07/18/2019 0336 by Josepha Pigg, RN Outcome: Progressing Goal: Will not experience complications related to urinary retention 07/18/2019 0336 by Josepha Pigg, RN Outcome: Progressing 07/18/2019 0336 by Josepha Pigg, RN Outcome: Progressing   Problem: Pain Managment: Goal: General experience of comfort will improve 07/18/2019 0336 by Josepha Pigg, RN Outcome: Progressing 07/18/2019 0336 by Josepha Pigg, RN Outcome: Progressing   Problem: Safety: Goal: Ability to remain free from injury will improve 07/18/2019 0336 by Josepha Pigg, RN Outcome: Progressing 07/18/2019 0336 by Josepha Pigg, RN Outcome: Progressing   Problem: Skin Integrity: Goal: Risk for impaired skin integrity will decrease 07/18/2019 0336 by Josepha Pigg, RN Outcome: Progressing 07/18/2019 0336 by Josepha Pigg, RN Outcome: Progressing

## 2019-07-18 NOTE — Progress Notes (Addendum)
Family with request to speak with MD regarding dose of Na bicarbonate tabs tonight.   Dr Sharlet Salina with call back and spoke with son concerning the  above stated. Family with many questions and concerns about medicines and test/ procedures that the MD has ordered.    Family often disregards Nurse stated explanations regarding orders.  ie:  lab draws and CT exams and insulin administrations  .  Family provided with reassurance and encouraged to allow lab staff to get MD ordered labs.

## 2019-07-18 NOTE — Progress Notes (Signed)
PROGRESS NOTE    Crystal Jacobs  NOB:[id number] DOB: 11-Nov-1960 DOA: 07/13/2019 PCP: Patient, No Pcp Per     Brief Narrative:  59 y.o. female from [place] PMHx DM type II uncontrolled with complication, HTN, CKD stage V, s/p Nephrectomy who presented to [facility] ED for evaluation of generalized weakness.  History is supplemented by daughter at bedside.  About 2 weeks ago patient began to feel generally weak.  She had been having constipation and about 1 week ago she took Dulcolax.  The following day she had 9 bowel movements which have reportedly normalized since then.  She has however been feeling very weak and unable to perform the usual amount of activity she is used to.  She says she has had poor appetite and low oral intake.  She has had some nausea and vomiting.  She says she has a history of CKD stage V weight loss reported creatinine 7.7 in April 2021.  She says she is makes a small amount of urine without dysuria.  She says she usually gets injections to keep her blood counts up.  She denies any obvious bleeding.  She says she now again has decreased bowel movements than expected.  She is visiting from [place] and arrived 1 month ago.  She has been on a low-sodium diet.  She has a medication list which reads as follows:  Lantus 8 units once a day Novorapid 6 units daily Pantoprazole Multivitamin Minipress XL 5 mg twice daily Sodium bicarb 500 twice daily Febuxostat40 mg daily for gout Clopitab (Plavix/ASA 75/10) daily Cilnidipine 10/50 9 am and 9 pm   Subjective: 6/24.afebrile overnight much more awake.  A/O x1 (does not know where, when, why) but no longer speaking gibberish.  States she is starting to feel better.      Assessment & Plan: Covid vaccination; +1/2 vaccination    Principal Problem:   Hyponatremia Active Problems:   CKD (chronic kidney disease), stage V (HCC)   Microcytic anemia   Thrombocytopenia (HCC)   Insulin dependent type 2 diabetes  mellitus (HCC)   Hypochloremia  Acute altered mental status -Per daughter even though sodium has mostly corrected patient's mental status has worsened in the past 24 to 48 hours.  States mother is talking gibberish unable to answer question coherently.  This a.m. combative unable to coherently answer questions. -CT head negative for acute findings see results below -Ammonia WNL -Pan culture pending (patient has been hypothermic) -ABG does not account for patient's altered mental status. -6/24 patient has obtain head CT decrease Haldol 1 mg BID. PRN -6/24 NO MEDICATION FOR SLEEPING.  PRIMARY TEAM only to change medication.  Severe hypovolemic Hyponatremia/Hypochloremia: -Hold all patient's sodium has improved mental status has not in fact has worsened.   Recent Labs  Lab 07/17/19 0516 07/17/19 1352 07/17/19 1731 07/17/19 2232 07/18/19 0435  NA 129* 129* 128* 130* 128*  -Per daughter who has been at her bedside patient's mental status has continued to deteriorate in the last 48 to 24 hours. -6/24 per daughter patient is improving cognition wise but not quite back to baseline.  I agree after exam that her cognition has improved today.  Essential HTN -6/23 Amlodipine 10 mg daily -6/23 Hydralazine PRN -6/24 normalize BP.  CT shows negative stroke   CKD (chronic kidney disease), stage V ([place]) -Patient has a fistula in the left arm with a good thrill, the daughter relates that her creatinine usually ranges around 7. -6/24 first day of HD  Chronic thrombocytopenia: -At home she takes aspirin and Plavix. -Thrombocytopenia continues to worsen.  Although may be reactive could also be secondary to heparin. Will discontinue heparin -6/23 HIT panel pending -If nephrology decides on CRRT in the a.m. we will need to restart an anticoagulant.  Microcytic anemia anemia of chronic renal disease: She states she has been receiving erythropoietin as an outpatient. Anemia panel is pending, her  current hemoglobin is 12.5. Recent Labs  Lab 07/13/19 2101 07/14/19 0205 07/17/19 0516 07/18/19 0435  HGB 12.5 12.2 10.9* 8.8*  -Hemoccult pending  DM type II controlled with complication -6/29 hemoglobin A1c = 6.2  -Levemir 2 units daily -NovoLog 2 units qac  Agitation/Anxiety -6/24 Haldol 2 mg BID PRN  Metabolic acidosis? -Improved.  Should resolve with HD  Hypokalemia/Hyperkalemia: -Resolved  Underweight: Continue her renal diet.  Bacteriuria -6/24 start empiric antibiotics, urine is dirty.  Urine culture pending    DVT prophylaxis: SCD Code Status: Full Family Communication: 6/24 spoke with daughter, discussed plan of care answered all questions Status is: Inpatient    Dispo: The patient is from: Home              Anticipated d/c is to: Home              Anticipated d/c date is: 6/30              Patient currently unstable      Consultants:  Nephrology Dr. Hollie Salk   Procedures/Significant Events:  6/24 CT head W0 contrast; chronic small vessel disease without acute intracranial abnormality    I have personally reviewed and interpreted all radiology studies and my findings are as above.  VENTILATOR SETTINGS:    Cultures 6/19 SARS coronavirus negative 6/20 HIV nonreactive 6/23 blood RIGHT AC NGTD 6/23 blood LEFT wrist NGTD 6/24 urine pending    Antimicrobials: Anti-infectives (From admission, onward)   Start     Ordered Stop   07/18/19 1000  cefTRIAXone (ROCEPHIN) 1 g in sodium chloride 0.9 % 100 mL IVPB     Discontinue     07/18/19 0905         Devices    LINES / TUBES:      Continuous Infusions: . sodium chloride    . sodium chloride 50 mL/hr at 07/18/19 0905  . cefTRIAXone (ROCEPHIN)  IV       Objective: Vitals:   07/17/19 2234 07/17/19 2355 07/18/19 0554 07/18/19 0835  BP: (!) 178/71 (!) 145/63 (!) 154/79 (!) 153/67  Pulse: (!) 102 99 99 91  Resp: 18 17 17 16   Temp: 98.4 F (36.9 C)  97.8 F (36.6 C) (!)  97.4 F (36.3 C)  TempSrc: Oral  Oral Oral  SpO2: 100%  100% 100%  Weight:      Height:        Intake/Output Summary (Last 24 hours) at 07/18/2019 0943 Last data filed at 07/17/2019 2130 Gross per 24 hour  Intake 120 ml  Output --  Net 120 ml   Filed Weights   07/14/19 0047 07/14/19 0500 07/15/19 0357  Weight: 43.8 kg 43.8 kg 44.2 kg   Physical Exam:  General: A/O x1 (does not know where, when, why), but is answering questions.  No acute respiratory distress Eyes: negative scleral hemorrhage, negative anisocoria, negative icterus ENT: Negative Runny nose, negative gingival bleeding, Neck:  Negative scars, masses, torticollis, lymphadenopathy, JVD Lungs: Clear to auscultation bilaterally without wheezes or crackles Cardiovascular: Regular rate and rhythm without murmur gallop or rub  normal S1 and S2 Abdomen: negative abdominal pain, nondistended, positive soft, bowel sounds, no rebound, no ascites, no appreciable mass Extremities: No significant cyanosis, clubbing, or edema bilateral lower extremities.  LEFT arm fistula working well on day 1 HD Skin: Negative rashes, lesions, ulcers Psychiatric:  Negative depression, negative anxiety, negative fatigue, negative mania  Central nervous system:  Cranial nerves II through XII intact, tongue/uvula midline, all extremities muscle strength 5/5, sensation intact throughout, negative dysarthria, negative expressive aphasia, negative receptive aphasia.  .     Data Reviewed: Care during the described time interval was provided by me .  I have reviewed this patient's available data, including medical history, events of note, physical examination, and all test results as part of my evaluation.  CBC: Recent Labs  Lab 07/13/19 2101 07/14/19 0205 07/17/19 0516 07/18/19 0435  WBC 12.9* 12.0* 8.3 8.2  NEUTROABS 10.8*  --   --  6.9  HGB 12.5 12.2 10.9* 8.8*  HCT 38.5 37.0 34.8* 27.8*  MCV 60.4* 59.7* 62.6* 62.2*  PLT 108* 95* 82* 83*    Basic Metabolic Panel: Recent Labs  Lab 07/17/19 0516 07/17/19 1352 07/17/19 1731 07/17/19 2232 07/18/19 0435  NA 129* 129* 128* 130* 128*  K 4.3 5.5* 4.5 5.0 4.6  CL 97* 100 97* 97* 98  CO2 18* 17* 20* 22 21*  GLUCOSE 114* 152* 173* 206* 143*  BUN 37* 39* 34* 37* 37*  CREATININE 5.91* 5.93* 5.87* 6.06* 6.02*  CALCIUM 9.4 8.8* 9.2 9.6 8.9  MG  --   --   --   --  2.2  PHOS 3.4 3.5 3.7 3.8 3.9   GFR: Estimated Creatinine Clearance: 7.3 mL/min (A) (by C-G formula based on SCr of 6.02 mg/dL (H)). Liver Function Tests: Recent Labs  Lab 07/13/19 2101 07/14/19 0907 07/17/19 0516 07/17/19 1352 07/17/19 1731 07/17/19 2232 07/18/19 0435  AST 37  --   --  40  --   --  40  ALT 29  --   --  22  --   --  18  ALKPHOS 73  --   --  47  --   --  45  BILITOT 1.5*  --   --  1.0  --   --  0.8  PROT 7.0  --   --  5.2*  --   --  5.1*  ALBUMIN 3.7   < > 3.3* 2.8* 3.0* 3.1* 2.7*   < > = values in this interval not displayed.   Recent Labs  Lab 07/13/19 2101  LIPASE 47   Recent Labs  Lab 07/17/19 1731  AMMONIA 22   Coagulation Profile: No results for input(s): INR, PROTIME in the last 168 hours. Cardiac Enzymes: No results for input(s): CKTOTAL, CKMB, CKMBINDEX, TROPONINI in the last 168 hours. BNP (last 3 results) No results for input(s): PROBNP in the last 8760 hours. HbA1C: No results for input(s): HGBA1C in the last 72 hours. CBG: Recent Labs  Lab 07/17/19 0817 07/17/19 1149 07/17/19 1636 07/17/19 2232 07/18/19 0830  GLUCAP 119* 165* 115* 202* 87   Lipid Profile: No results for input(s): CHOL, HDL, LDLCALC, TRIG, CHOLHDL, LDLDIRECT in the last 72 hours. Thyroid Function Tests: No results for input(s): TSH, T4TOTAL, FREET4, T3FREE, THYROIDAB in the last 72 hours. Anemia Panel: No results for input(s): VITAMINB12, FOLATE, FERRITIN, TIBC, IRON, RETICCTPCT in the last 72 hours. Sepsis Labs: No results for input(s): PROCALCITON, LATICACIDVEN in the last 168  hours.  Recent Results (from the past  240 hour(s))  SARS Coronavirus 2 by RT PCR (hospital order, performed in [facility] hospital lab) Nasopharyngeal Nasopharyngeal Swab     Status: None   Collection Time: 07/13/19 10:30 PM   Specimen: Nasopharyngeal Swab  Result Value Ref Range Status   SARS Coronavirus 2 NEGATIVE NEGATIVE Final    Comment: (NOTE) SARS-CoV-2 target nucleic acids are NOT DETECTED.  The SARS-CoV-2 RNA is generally detectable in upper and lower respiratory specimens during the acute phase of infection. The lowest concentration of SARS-CoV-2 viral copies this assay can detect is 250 copies / mL. A negative result does not preclude SARS-CoV-2 infection and should not be used as the sole basis for treatment or other patient management decisions.  A negative result may occur with improper specimen collection / handling, submission of specimen other than nasopharyngeal swab, presence of viral mutation(s) within the areas targeted by this assay, and inadequate number of viral copies (<250 copies / mL). A negative result must be combined with clinical observations, patient history, and epidemiological information.  Fact Sheet for Patients:   StrictlyIdeas.no  Fact Sheet for Healthcare Providers: BankingDealers.co.za  This test is not yet approved or  cleared by the [place] FDA and has been authorized for detection and/or diagnosis of SARS-CoV-2 by FDA under an Emergency Use Authorization (EUA).  This EUA will remain in effect (meaning this test can be used) for the duration of the COVID-19 declaration under Section 564(b)(1) of the Act, 21 U.S.C. section 360bbb-3(b)(1), unless the authorization is terminated or revoked sooner.  Performed at [facility], [address] [place], [place] [address]   MRSA PCR Screening     Status: None   Collection Time: 07/14/19 12:27 AM  Result Value Ref Range Status    MRSA by PCR NEGATIVE NEGATIVE Final    Comment:        The GeneXpert MRSA Assay (FDA approved for NASAL specimens only), is one component of a comprehensive MRSA colonization surveillance program. It is not intended to diagnose MRSA infection nor to guide or monitor treatment for MRSA infections. Performed at [facility], [place] [address], [place], [place] [address]          Radiology Studies: CT HEAD WO CONTRAST  Result Date: 07/17/2019 CLINICAL DATA:  Generalized weakness. EXAM: CT HEAD WITHOUT CONTRAST TECHNIQUE: Contiguous axial images were obtained from the base of the skull through the vertex without intravenous contrast. COMPARISON:  None. FINDINGS: Brain: There is no mass, hemorrhage or extra-axial collection. The size and configuration of the ventricles and extra-axial CSF spaces are normal. There is hypoattenuation of the white matter, most commonly indicating chronic small vessel disease. Vascular: No abnormal hyperdensity of the major intracranial arteries or dural venous sinuses. No intracranial atherosclerosis. Skull: The visualized skull base, calvarium and extracranial soft tissues are normal. Sinuses/Orbits: No fluid levels or advanced mucosal thickening of the visualized paranasal sinuses. No mastoid or middle ear effusion. The orbits are normal. IMPRESSION: Chronic small vessel disease without acute intracranial abnormality. Electronically Signed   By: Ulyses Jarred M.D.   On: 07/17/2019 20:22   DG Abd Portable 1V  Result Date: 07/17/2019 CLINICAL DATA:  Evaluate for possible constipation EXAM: PORTABLE ABDOMEN - 1 VIEW COMPARISON:  None. FINDINGS: Scattered large and small bowel gas is noted. No significant retained fecal material is noted to suggest constipation. No free air is noted. No mass lesion is seen. Bony structures appear within normal limits. IMPRESSION: No acute abnormality noted. Electronically  Signed   By: Inez Catalina M.D.   On: 07/17/2019 16:58         Scheduled Meds: . amLODipine  10 mg Oral Daily  . aspirin EC  81 mg Oral Daily  . cholecalciferol  1,000 Units Oral Q1400  . clopidogrel  75 mg Oral Daily  . darbepoetin (ARANESP) injection - NON-DIALYSIS  60 mcg Subcutaneous Q Thu-1800  . febuxostat  40 mg Oral Q1400  . haloperidol lactate  2 mg Intravenous TID  . insulin aspart  0-6 Units Subcutaneous TID WC  . insulin aspart  2 Units Subcutaneous TID WC  . insulin detemir  2 Units Subcutaneous QHS  . sodium bicarbonate  1,300 mg Oral BID  . sodium chloride flush  3 mL Intravenous Q12H  . sodium chloride flush  3 mL Intravenous Q12H   Continuous Infusions: . sodium chloride    . sodium chloride 50 mL/hr at 07/18/19 0905  . cefTRIAXone (ROCEPHIN)  IV       LOS: 4 days    Time spent:40 min    Mahathi Pokorney, Geraldo Docker, MD [facility] Pager [phone]  If 7PM-7AM, please contact night-coverage [email or web address] Password [facility] 07/18/2019, 9:43 AM

## 2019-07-18 NOTE — Progress Notes (Signed)
Initial Nutrition Assessment  DOCUMENTATION CODES:   Underweight  INTERVENTION:   -Renal MVI daily -Nepro Shake po BID, each supplement provides 425 kcal and 19 grams protein -Magic cup TID with meals, each supplement provides 290 kcal and 9 grams of protein  NUTRITION DIAGNOSIS:   Inadequate oral intake related to poor appetite as evidenced by per patient/family report.  GOAL:   Patient will meet greater than or equal to 90% of their needs  MONITOR:   PO intake, Supplement acceptance, Labs, Weight trends, Skin, I & O's  REASON FOR ASSESSMENT:   Consult Assessment of nutrition requirement/status  ASSESSMENT:   Crystal Jacobs is a 59 y.o. female with reported medical history significant for insulin-dependent type 2 diabetes, hypertension, CKD stage V, reported history of nephrectomy who presented to [place] ED for evaluation of generalized weakness  Pt admitted with severe hyponatremia.   Reviewed I/O's: +120 ml x 24 hours and +3.2 L since admission  Attempted to assess pt multiple times. When visiting room x 2, pt either receiving nursing care or in with MD at time of visit. RD also attempted to call pt room via hospital room phone, however, no answer. Pt is now out of room due to HD.   Per nephrology notes, pt with AVF placed in March, but has not required HD PTA. Plan to initiate HD today (pt uremic).   Per chart review, pt resides in [place], but has been visiting [place] for about one month. Pt has had decreased oral intake, nausea, and diarrhea over the past 2 weeks. Appetite is good at baseline and usually follows a low sodium diet.   Reviewed meal completion records; PO: 50%. Pt complains of low appetite.   Due to decreased oral intake and underweight status, suspect pt with malnutrition, however, unable to identify at this time. RD would greatly benefit from addition of oral nutrition supplements.    Lab Results  Component Value Date   HGBA1C 6.2 (H)  07/14/2019   PTA DM medications are unknown.   Labs reviewed: Na: 128, CBGS: 87-202 (inpatient orders for glycemic control are 0-6 units insulin aspart TID with meals, 2 units insulin aspart TID with meals, and 2 units insulin detemir daily at bedtime).   Diet Order:   Diet Order            Diet renal with fluid restriction Fluid restriction: 2000 mL Fluid; Room service appropriate? Yes; Fluid consistency: Thin  Diet effective now                 EDUCATION NEEDS:   No education needs have been identified at this time  Skin:  Skin Assessment: Reviewed RN Assessment  Last BM:  07/16/19  Height:   Ht Readings from Last 1 Encounters:  07/13/19 5\' 2"  (1.575 m)    Weight:   Wt Readings from Last 1 Encounters:  07/15/19 44.2 kg    Ideal Body Weight:  50 kg  BMI:  Body mass index is 17.82 kg/m.  Estimated Nutritional Needs:   Kcal:  1350-1550  Protein:  65-80 grams  Fluid:  1000 ml + UOP    Loistine Chance, RD, LDN, CDCES Registered Dietitian II Certified Diabetes Care and Education Specialist Please refer to [facility] for RD and/or RD on-call/weekend/after hours pager

## 2019-07-18 NOTE — Plan of Care (Signed)

## 2019-07-18 NOTE — Progress Notes (Addendum)
Crystal Jacobs Progress Note    Assessment/ Plan:   1. Hyponatremia - Na+ 106 on admit 6/19 w/ orthostatic symptoms and recent N/V, diarrhea.  Hypovolemic. Na + improving w/ normal saline up to 117 6/20, s/p D5W at 50/hr x 4 hrs and stabilized, then NS resumed at 75/ hr.  Na getting better, 123--> 126--> 129--> 130-->128.  IVFs resumed.  2. CKD advanced - had AVF placed in March.  Creat 6.5 > 5.5-- 6.2 here w/ IVF's.  Long discussion with pt and son again today.  Now that we are correcting Na, pt still has poor appetite and + asterixis.  Son has requested that I revisit in the PM after pt has had some food, sat up in the chair, and we've gotten more labs.  Will do so.    Interested in [place] and avoiding dialysis as long as possible but will treat if needed.  She appears to be uremic.  Long discussion- pt and family OK with starting dialysis.  Will start IHD #1 today.    3. Acute encephalopathy: CT head negative, ABG OK, ammonia OK, AXR without constipation, blood cultures pending.  UA on admission with bacteruria, will empirically rx with Rocephin and reculture.  4. Hypovolemia - improving, orthostatics + but overall hypertensive, amlodipine restarted, also on metop and minipress at home  5. HyperK- resolved  6. IDDM - per primary team  7.  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 Dispo: if it ends up that pt is ESRD, improves with dialysis, and needs this rx--> pt's children would like to have her do HHD.  Have reached out to [facility] manager to see if hospital discharge to home training is even possible.  Subjective:    Long discussion with pt, son, dtr today.  Slightly better than yesterday.  Encephalopathy workup largely negative   Objective:   BP (!) 153/67 (BP Location: Right Arm)    Pulse 91    Temp (!) 97.4 F (36.3 C) (Oral)    Resp 16    Ht 5\' 2"  (1.575 m)    Wt 44.2 kg    LMP  (LMP Unknown)    SpO2 100%    BMI 17.82 kg/m    Intake/Output Summary (Last 24 hours) at 07/18/2019 1238 Last data filed at 07/17/2019 2130 Gross per 24 hour  Intake 120 ml  Output --  Net 120 ml   Weight change:   Physical Exam: Gen: NAD, lying in bed, sleepy CVS: RRR Resp: clear  Abd: soft Ext: no LE edema ACCESS: LUE AVF + T/B NEURO: AAO but doesn't always make sense, + asterixis  Imaging: CT HEAD WO CONTRAST  Result Date: 07/17/2019 CLINICAL DATA:  Generalized weakness. EXAM: CT HEAD WITHOUT CONTRAST TECHNIQUE: Contiguous axial images were obtained from the base of the skull through the vertex without intravenous contrast. COMPARISON:  None. FINDINGS: Brain: There is no mass, hemorrhage or extra-axial collection. The size and configuration of the ventricles and extra-axial CSF spaces are normal. There is hypoattenuation of the white matter, most commonly indicating chronic small vessel disease. Vascular: No abnormal hyperdensity of the major intracranial arteries or dural venous sinuses. No intracranial atherosclerosis. Skull: The visualized skull base, calvarium and extracranial soft tissues are normal. Sinuses/Orbits: No fluid levels or advanced mucosal thickening of the visualized paranasal sinuses. No mastoid or middle ear effusion. The orbits are normal. IMPRESSION: Chronic small vessel disease without acute intracranial abnormality. Electronically Signed   By:  Ulyses Jarred M.D.   On: 07/17/2019 20:22   DG Abd Portable 1V  Result Date: 07/17/2019 CLINICAL DATA:  Evaluate for possible constipation EXAM: PORTABLE ABDOMEN - 1 VIEW COMPARISON:  None. FINDINGS: Scattered large and small bowel gas is noted. No significant retained fecal material is noted to suggest constipation. No free air is noted. No mass lesion is seen. Bony structures appear within normal limits. IMPRESSION: No acute abnormality noted. Electronically Signed   By: Inez Catalina M.D.   On: 07/17/2019 16:58    Labs: BMET Recent Labs  Lab 07/16/19 1805  07/16/19 2309 07/17/19 0516 07/17/19 1352 07/17/19 1731 07/17/19 2232 07/18/19 0435  NA 123* 126* 129* 129* 128* 130* 128*  K 4.4 4.1 4.3 5.5* 4.5 5.0 4.6  CL 94* 97* 97* 100 97* 97* 98  CO2 18* 19* 18* 17* 20* 22 21*  GLUCOSE 130* 84 114* 152* 173* 206* 143*  BUN 39* 38* 37* 39* 34* 37* 37*  CREATININE 6.03* 5.90* 5.91* 5.93* 5.87* 6.06* 6.02*  CALCIUM 8.9 8.4* 9.4 8.8* 9.2 9.6 8.9  PHOS 3.4 3.1 3.4 3.5 3.7 3.8 3.9   CBC Recent Labs  Lab 07/13/19 2101 07/14/19 0205 07/17/19 0516 07/18/19 0435  WBC 12.9* 12.0* 8.3 8.2  NEUTROABS 10.8*  --   --  6.9  HGB 12.5 12.2 10.9* 8.8*  HCT 38.5 37.0 34.8* 27.8*  MCV 60.4* 59.7* 62.6* 62.2*  PLT 108* 95* 82* 83*    Medications:     amLODipine  10 mg Oral Daily   aspirin EC  81 mg Oral Daily   [START ON 07/19/2019] Chlorhexidine Gluconate Cloth  6 each Topical Q0600   cholecalciferol  1,000 Units Oral Q1400   clopidogrel  75 mg Oral Daily   darbepoetin (ARANESP) injection - NON-DIALYSIS  60 mcg Subcutaneous Q Thu-1800   febuxostat  40 mg Oral Q1400   haloperidol lactate  1 mg Intravenous BID   insulin aspart  0-6 Units Subcutaneous TID WC   insulin aspart  2 Units Subcutaneous TID WC   insulin detemir  2 Units Subcutaneous QHS   sodium bicarbonate  1,300 mg Oral BID   sodium chloride flush  3 mL Intravenous Q12H   sodium chloride flush  3 mL Intravenous Q12H      Crystal Lips, MD 07/18/2019, 12:38 PM

## 2019-07-19 LAB — COMPREHENSIVE METABOLIC PANEL
ALT: 21 U/L (ref 0–44)
AST: 24 U/L (ref 15–41)
Albumin: 2.7 g/dL — ABNORMAL LOW (ref 3.5–5.0)
Alkaline Phosphatase: 45 U/L (ref 38–126)
Anion gap: 11 (ref 5–15)
BUN: 18 mg/dL (ref 6–20)
CO2: 25 mmol/L (ref 22–32)
Calcium: 8.8 mg/dL — ABNORMAL LOW (ref 8.9–10.3)
Chloride: 99 mmol/L (ref 98–111)
Creatinine, Ser: 3.71 mg/dL — ABNORMAL HIGH (ref 0.44–1.00)
GFR calc Af Amer: 15 mL/min — ABNORMAL LOW (ref >60)
GFR calc non Af Amer: 13 mL/min — ABNORMAL LOW (ref >60)
Glucose, Bld: 77 mg/dL (ref 70–99)
Potassium: 3.5 mmol/L (ref 3.5–5.1)
Sodium: 135 mmol/L (ref 135–145)
Total Bilirubin: 0.7 mg/dL (ref 0.3–1.2)
Total Protein: 5.1 g/dL — ABNORMAL LOW (ref 6.5–8.1)

## 2019-07-19 LAB — CBC WITH DIFFERENTIAL/PLATELET
Abs Immature Granulocytes: 0.09 10*3/uL — ABNORMAL HIGH (ref 0.00–0.07)
Basophils Absolute: 0.1 10*3/uL (ref 0.0–0.1)
Basophils Relative: 1 %
Eosinophils Absolute: 0 10*3/uL (ref 0.0–0.5)
Eosinophils Relative: 0 %
HCT: 27.6 % — ABNORMAL LOW (ref 36.0–46.0)
Hemoglobin: 8.7 g/dL — ABNORMAL LOW (ref 12.0–15.0)
Immature Granulocytes: 1 %
Lymphocytes Relative: 14 %
Lymphs Abs: 1 10*3/uL (ref 0.7–4.0)
MCH: 19.6 pg — ABNORMAL LOW (ref 26.0–34.0)
MCHC: 31.5 g/dL (ref 30.0–36.0)
MCV: 62.3 fL — ABNORMAL LOW (ref 80.0–100.0)
Monocytes Absolute: 0.6 10*3/uL (ref 0.1–1.0)
Monocytes Relative: 9 %
Neutro Abs: 5.5 10*3/uL (ref 1.7–7.7)
Neutrophils Relative %: 75 %
Platelets: 83 10*3/uL — ABNORMAL LOW (ref 150–400)
RBC: 4.43 MIL/uL (ref 3.87–5.11)
RDW: 16.7 % — ABNORMAL HIGH (ref 11.5–15.5)
WBC: 7.3 10*3/uL (ref 4.0–10.5)
nRBC: 1.1 % — ABNORMAL HIGH (ref 0.0–0.2)

## 2019-07-19 LAB — HEPARIN INDUCED PLATELET AB (HIT ANTIBODY): Heparin Induced Plt Ab: 0.194 OD (ref 0.000–0.400)

## 2019-07-19 LAB — PHOSPHORUS: Phosphorus: 3.5 mg/dL (ref 2.5–4.6)

## 2019-07-19 LAB — GLUCOSE, CAPILLARY
Glucose-Capillary: 70 mg/dL (ref 70–99)
Glucose-Capillary: 169 mg/dL — ABNORMAL HIGH (ref 70–99)
Glucose-Capillary: 85 mg/dL (ref 70–99)
Glucose-Capillary: 127 mg/dL — ABNORMAL HIGH (ref 70–99)

## 2019-07-19 LAB — MAGNESIUM: Magnesium: 2 mg/dL (ref 1.7–2.4)

## 2019-07-19 NOTE — Plan of Care (Signed)

## 2019-07-19 NOTE — Progress Notes (Signed)
[facility] Progress Note    Assessment/ Plan:   1. Hyponatremia - Na+ 106 on admit 6/19 w/ orthostatic symptoms and recent N/V, diarrhea.  Hypovolemic. Na + improving w/ normal saline up to 117 6/20, s/p D5W at 50/hr x 4 hrs and stabilized, then NS resumed at 75/ hr.  Na getting better, 123--> 126--> 129--> 130-->128.  Now normalized with HD, stop IVFs  2. CKD advanced - had AVF placed in March.  Creat 6.5 > 5.5-- 6.2 here w/ IVF's.  Long discussion with pt and son again today.  Now that we are correcting Na, pt still has poor appetite and + asterixis.  Son has requested that I revisit in the PM after pt has had some food, sat up in the chair, and we've gotten more labs.  Will do so.    Interested in [place] and avoiding dialysis as long as possible but will treat if needed.  Discussion with pt and son.  HD #1 yesterday, HD #2 today.  Improvement in symptoms with HD--> likely ESRD.  Questions answered re: kidney transplant, home HD, and if she even does need to continue dialysis beyond the hospital (my recommendation is yes).  Renal navigator has been notified.  3. Acute encephalopathy: CT head negative, ABG OK, ammonia OK, AXR without constipation, blood cultures pending.  UA on admission with bacteruria, will empirically rx with Rocephin and reculture.  4. Hypovolemia - improving, orthostatics + but overall hypertensive, amlodipine restarted, also on metop and minipress at home  5. HyperK- resolved  6. IDDM - per primary team  7.  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 Dispo: if it ends up that pt is ESRD, improves with dialysis, and needs this rx--> pt's children would like to have her do HHD.  Have reached out to [facility] manager to see if hospital discharge to home training is even possible.  Subjective:    Underwent HD #1 yesterday and did well.  Much more awake and alert this AM.  Asking for food.   Objective:   BP (!)  149/63    Pulse 97    Temp 98.4 F (36.9 C) (Oral)    Resp 16    Ht 5\' 2"  (1.575 m)    Wt 45.4 kg    LMP  (LMP Unknown)    SpO2 100%    BMI 18.31 kg/m   Intake/Output Summary (Last 24 hours) at 07/19/2019 1122 Last data filed at 07/19/2019 0600 Gross per 24 hour  Intake 790.55 ml  Output 0 ml  Net 790.55 ml   Weight change:   Physical Exam: Gen: NAD, lying in bed, sitting up and watching iPad CVS: RRR Resp: clear  Abd: soft Ext: no LE edema ACCESS: LUE AVF + T/B NEURO: AAOx 3, asterixis improved  Imaging: CT HEAD WO CONTRAST  Result Date: 07/17/2019 CLINICAL DATA:  Generalized weakness. EXAM: CT HEAD WITHOUT CONTRAST TECHNIQUE: Contiguous axial images were obtained from the base of the skull through the vertex without intravenous contrast. COMPARISON:  None. FINDINGS: Brain: There is no mass, hemorrhage or extra-axial collection. The size and configuration of the ventricles and extra-axial CSF spaces are normal. There is hypoattenuation of the white matter, most commonly indicating chronic small vessel disease. Vascular: No abnormal hyperdensity of the major intracranial arteries or dural venous sinuses. No intracranial atherosclerosis. Skull: The visualized skull base, calvarium and extracranial soft tissues are normal. Sinuses/Orbits: No fluid levels or advanced mucosal thickening  of the visualized paranasal sinuses. No mastoid or middle ear effusion. The orbits are normal. IMPRESSION: Chronic small vessel disease without acute intracranial abnormality. Electronically Signed   By: Ulyses Jarred M.D.   On: 07/17/2019 20:22   DG Abd Portable 1V  Result Date: 07/17/2019 CLINICAL DATA:  Evaluate for possible constipation EXAM: PORTABLE ABDOMEN - 1 VIEW COMPARISON:  None. FINDINGS: Scattered large and small bowel gas is noted. No significant retained fecal material is noted to suggest constipation. No free air is noted. No mass lesion is seen. Bony structures appear within normal limits.  IMPRESSION: No acute abnormality noted. Electronically Signed   By: Inez Catalina M.D.   On: 07/17/2019 16:58    Labs: BMET Recent Labs  Lab 07/16/19 2309 07/17/19 0516 07/17/19 1352 07/17/19 1731 07/17/19 2232 07/18/19 0435 07/19/19 0401  NA 126* 129* 129* 128* 130* 128* 135  K 4.1 4.3 5.5* 4.5 5.0 4.6 3.5  CL 97* 97* 100 97* 97* 98 99  CO2 19* 18* 17* 20* 22 21* 25  GLUCOSE 84 114* 152* 173* 206* 143* 77  BUN 38* 37* 39* 34* 37* 37* 18  CREATININE 5.90* 5.91* 5.93* 5.87* 6.06* 6.02* 3.71*  CALCIUM 8.4* 9.4 8.8* 9.2 9.6 8.9 8.8*  PHOS 3.1 3.4 3.5 3.7 3.8 3.9 3.5   CBC Recent Labs  Lab 07/13/19 2101 07/13/19 2101 07/14/19 0205 07/17/19 0516 07/18/19 0435 07/19/19 0401  WBC 12.9*   < > 12.0* 8.3 8.2 7.3  NEUTROABS 10.8*  --   --   --  6.9 5.5  HGB 12.5   < > 12.2 10.9* 8.8* 8.7*  HCT 38.5   < > 37.0 34.8* 27.8* 27.6*  MCV 60.4*   < > 59.7* 62.6* 62.2* 62.3*  PLT 108*   < > 95* 82* 83* 83*   < > = values in this interval not displayed.    Medications:     amLODipine  10 mg Oral Daily   aspirin EC  81 mg Oral Daily   Chlorhexidine Gluconate Cloth  6 each Topical Q0600   cholecalciferol  1,000 Units Oral Q1400   clopidogrel  75 mg Oral Daily   darbepoetin (ARANESP) injection - NON-DIALYSIS  60 mcg Subcutaneous Q Thu-1800   febuxostat  40 mg Oral Q1400   feeding supplement (NEPRO CARB STEADY)  237 mL Oral BID BM   insulin aspart  0-6 Units Subcutaneous TID WC   insulin aspart  2 Units Subcutaneous TID WC   insulin detemir  2 Units Subcutaneous QHS   multivitamin  1 tablet Oral QHS   sodium bicarbonate  1,300 mg Oral BID   sodium chloride flush  3 mL Intravenous Q12H   sodium chloride flush  3 mL Intravenous Q12H      Madelon Lips, MD 07/19/2019, 11:22 AM

## 2019-07-19 NOTE — Progress Notes (Signed)
Two attempts were made, pt is off the floor to HD.  Retry as time and pt allow.   07/19/19 1600  PT Visit Information  Last PT Received On 07/19/19  Reason Eval/Treat Not Completed Other (comment)    Mee Hives, PT MS Acute Rehab Dept. Number: [place]

## 2019-07-19 NOTE — Progress Notes (Signed)
Patient is off the floor - to dialysis

## 2019-07-19 NOTE — Progress Notes (Signed)
PROGRESS NOTE    Lianna Sitzmann  QIH:[id number] DOB: 07-16-1960 DOA: 07/13/2019 PCP: Patient, No Pcp Per     Brief Narrative:  59 y.o. female from [place] PMHx DM type II uncontrolled with complication, HTN, CKD stage V, s/p Nephrectomy who presented to [facility] ED for evaluation of generalized weakness.  History is supplemented by daughter at bedside.  About 2 weeks ago patient began to feel generally weak.  She had been having constipation and about 1 week ago she took Dulcolax.  The following day she had 9 bowel movements which have reportedly normalized since then.  She has however been feeling very weak and unable to perform the usual amount of activity she is used to.  She says she has had poor appetite and low oral intake.  She has had some nausea and vomiting.  She says she has a history of CKD stage V weight loss reported creatinine 7.7 in April 2021.  She says she is makes a small amount of urine without dysuria.  She says she usually gets injections to keep her blood counts up.  She denies any obvious bleeding.  She says she now again has decreased bowel movements than expected.  She is visiting from [place] and arrived 1 month ago.  She has been on a low-sodium diet.  She has a medication list which reads as follows:  Lantus 8 units once a day Novorapid 6 units daily Pantoprazole Multivitamin Minipress XL 5 mg twice daily Sodium bicarb 500 twice daily Febuxostat40 mg daily for gout Clopitab (Plavix/ASA 75/10) daily Cilnidipine 10/50 9 am and 9 pm   Subjective: 6/25 afebrile overnight, A/O x4.  States feeling much better.   Assessment & Plan: Covid vaccination; +1/2 vaccination    Principal Problem:   Hyponatremia Active Problems:   CKD (chronic kidney disease), stage V (HCC)   Microcytic anemia   Thrombocytopenia (HCC)   Insulin dependent type 2 diabetes mellitus (HCC)   Hypochloremia  Acute altered mental status -Per daughter even though sodium  has mostly corrected patient's mental status has worsened in the past 24 to 48 hours.  States mother is talking gibberish unable to answer question coherently.  This a.m. combative unable to coherently answer questions. -CT head negative for acute findings see results below -Ammonia WNL -Pan culture negative to date -ABG does not account for patient's altered mental status. -6/24 patient has obtain head CT decrease Haldol 1 mg BID. PRN -6/24 NO MEDICATION FOR SLEEPING.  PRIMARY TEAM only to change medication. -6/25 resolving  Severe hypovolemic Hyponatremia/Hypochloremia: -Hold all patient's sodium has improved mental status has not in fact has worsened.   Recent Labs  Lab 07/17/19 1352 07/17/19 1731 07/17/19 2232 07/18/19 0435 07/19/19 0401  NA 129* 128* 130* 128* 135  -Per daughter who has been at her bedside patient's mental status has continued to deteriorate in the last 48 to 24 hours. -6/24 per daughter patient is improving cognition wise but not quite back to baseline.  I agree after exam that her cognition has improved today. -6/25 resolved  Essential HTN -6/23 Amlodipine 10 mg daily -6/23 Hydralazine PRN -6/24 normalize BP.  CT shows negative stroke  -6/25 BP slightly elevated however given that patient is on HD would not alter at this time.  CKD (chronic kidney disease), stage V ([place]) -Patient has a fistula in the left arm with a good thrill, the daughter relates that her creatinine usually ranges around 7. -6/24 first day of HD Recent Labs  Lab 07/17/19 1352 07/17/19 1731 07/17/19 2232 07/18/19 0435 07/19/19 0401  CREATININE 5.93* 5.87* 6.06* 6.02* 3.71*    Chronic thrombocytopenia: -At home she takes aspirin and Plavix. -Thrombocytopenia continues to worsen.  Although may be reactive could also be secondary to heparin. Will discontinue heparin -6/23 HIT panel pending -Low but stable  Microcytic anemia anemia of chronic renal disease: She states she has  been receiving erythropoietin as an outpatient. Anemia panel is pending, her current hemoglobin is 12.5. Recent Labs  Lab 07/13/19 2101 07/14/19 0205 07/17/19 0516 07/18/19 0435 07/19/19 0401  HGB 12.5 12.2 10.9* 8.8* 8.7*  -Hemoccult pending  DM type II controlled with complication -7/51 hemoglobin A1c = 6.2  -Levemir 2 units daily -NovoLog 2 units qac  Agitation/Anxiety -6/24 Haldol 2 mg BID PRN  Metabolic acidosis? -Resolved  Hypokalemia/Hyperkalemia: -Resolved  Underweight: Continue her renal diet.  Bacteriuria -6/24 start empiric antibiotics, urine is dirty.  Urine culture pending    DVT prophylaxis: SCD Code Status: Full Family Communication: 6/24 spoke with daughter, discussed plan of care answered all questions Status is: Inpatient    Dispo: The patient is from: Home              Anticipated d/c is to: Home              Anticipated d/c date is: 6/30              Patient currently unstable      Consultants:  Nephrology Dr. Hollie Salk   Procedures/Significant Events:  6/24 CT head W0 contrast; chronic small vessel disease without acute intracranial abnormality    I have personally reviewed and interpreted all radiology studies and my findings are as above.  VENTILATOR SETTINGS:    Cultures 6/19 SARS coronavirus negative 6/20 MRSA by PCR negative 6/20 HIV nonreactive 6/23 blood RIGHT AC NGTD 6/23 blood LEFT wrist NGTD 6/24 urine pending    Antimicrobials: Anti-infectives (From admission, onward)   Start     Ordered Stop   07/18/19 1000  cefTRIAXone (ROCEPHIN) 1 g in sodium chloride 0.9 % 100 mL IVPB     Discontinue     07/18/19 0905         Devices    LINES / TUBES:      Continuous Infusions: . sodium chloride    . sodium chloride    . sodium chloride    . cefTRIAXone (ROCEPHIN)  IV 1 g (07/19/19 0932)     Objective: Vitals:   07/19/19 0425 07/19/19 0500 07/19/19 0839 07/19/19 0926  BP: (!) 138/56  (!) 149/63  (!) 149/63  Pulse: 82  97   Resp: 16  16   Temp: 98.6 F (37 C)  98.4 F (36.9 C)   TempSrc: Oral  Oral   SpO2: 100%  100%   Weight:  45.4 kg    Height:        Intake/Output Summary (Last 24 hours) at 07/19/2019 1029 Last data filed at 07/19/2019 0600 Gross per 24 hour  Intake 790.55 ml  Output 0 ml  Net 790.55 ml   Filed Weights   07/15/19 0357 07/18/19 1625 07/19/19 0500  Weight: 44.2 kg 44.6 kg 45.4 kg   Physical Exam:  General: A/O X4, no acute respiratory distress Eyes: negative scleral hemorrhage, negative anisocoria, negative icterus ENT: Negative Runny nose, negative gingival bleeding, Neck:  Negative scars, masses, torticollis, lymphadenopathy, JVD Lungs: Clear to auscultation bilaterally without wheezes or crackles Cardiovascular: Regular rate and rhythm without murmur gallop  or rub normal S1 and S2 Abdomen: negative abdominal pain, nondistended, positive soft, bowel sounds, no rebound, no ascites, no appreciable mass Extremities: No significant cyanosis, clubbing, or edema bilateral lower extremities Skin: Negative rashes, lesions, ulcers Psychiatric:  Negative depression, negative anxiety, negative fatigue, negative mania  Central nervous system:  Cranial nerves II through XII intact, tongue/uvula midline, all extremities muscle strength 5/5, sensation intact throughout, negative dysarthria, negative expressive aphasia, negative receptive aphasia.  .     Data Reviewed: Care during the described time interval was provided by me .  I have reviewed this patient's available data, including medical history, events of note, physical examination, and all test results as part of my evaluation.  CBC: Recent Labs  Lab 07/13/19 2101 07/14/19 0205 07/17/19 0516 07/18/19 0435 07/19/19 0401  WBC 12.9* 12.0* 8.3 8.2 7.3  NEUTROABS 10.8*  --   --  6.9 5.5  HGB 12.5 12.2 10.9* 8.8* 8.7*  HCT 38.5 37.0 34.8* 27.8* 27.6*  MCV 60.4* 59.7* 62.6* 62.2* 62.3*  PLT 108* 95*  82* 83* 83*   Basic Metabolic Panel: Recent Labs  Lab 07/17/19 1352 07/17/19 1731 07/17/19 2232 07/18/19 0435 07/19/19 0401  NA 129* 128* 130* 128* 135  K 5.5* 4.5 5.0 4.6 3.5  CL 100 97* 97* 98 99  CO2 17* 20* 22 21* 25  GLUCOSE 152* 173* 206* 143* 77  BUN 39* 34* 37* 37* 18  CREATININE 5.93* 5.87* 6.06* 6.02* 3.71*  CALCIUM 8.8* 9.2 9.6 8.9 8.8*  MG  --   --   --  2.2 2.0  PHOS 3.5 3.7 3.8 3.9 3.5   GFR: Estimated Creatinine Clearance: 12.1 mL/min (A) (by C-G formula based on SCr of 3.71 mg/dL (H)). Liver Function Tests: Recent Labs  Lab 07/13/19 2101 07/14/19 0907 07/17/19 1352 07/17/19 1731 07/17/19 2232 07/18/19 0435 07/19/19 0401  AST 37  --  40  --   --  40 24  ALT 29  --  22  --   --  18 21  ALKPHOS 73  --  47  --   --  45 45  BILITOT 1.5*  --  1.0  --   --  0.8 0.7  PROT 7.0  --  5.2*  --   --  5.1* 5.1*  ALBUMIN 3.7   < > 2.8* 3.0* 3.1* 2.7* 2.7*   < > = values in this interval not displayed.   Recent Labs  Lab 07/13/19 2101  LIPASE 47   Recent Labs  Lab 07/17/19 1731  AMMONIA 22   Coagulation Profile: No results for input(s): INR, PROTIME in the last 168 hours. Cardiac Enzymes: No results for input(s): CKTOTAL, CKMB, CKMBINDEX, TROPONINI in the last 168 hours. BNP (last 3 results) No results for input(s): PROBNP in the last 8760 hours. HbA1C: No results for input(s): HGBA1C in the last 72 hours. CBG: Recent Labs  Lab 07/18/19 0830 07/18/19 1234 07/18/19 1719 07/18/19 2134 07/19/19 0713  GLUCAP 87 199* 123* 217* 70   Lipid Profile: No results for input(s): CHOL, HDL, LDLCALC, TRIG, CHOLHDL, LDLDIRECT in the last 72 hours. Thyroid Function Tests: No results for input(s): TSH, T4TOTAL, FREET4, T3FREE, THYROIDAB in the last 72 hours. Anemia Panel: Recent Labs    07/18/19 1002  FERRITIN 1,582*  TIBC 157*  IRON 142   Sepsis Labs: No results for input(s): PROCALCITON, LATICACIDVEN in the last 168 hours.  Recent Results (from the  past 240 hour(s))  SARS Coronavirus 2 by RT PCR (  hospital order, performed in [facility] hospital lab) Nasopharyngeal Nasopharyngeal Swab     Status: None   Collection Time: 07/13/19 10:30 PM   Specimen: Nasopharyngeal Swab  Result Value Ref Range Status   SARS Coronavirus 2 NEGATIVE NEGATIVE Final    Comment: (NOTE) SARS-CoV-2 target nucleic acids are NOT DETECTED.  The SARS-CoV-2 RNA is generally detectable in upper and lower respiratory specimens during the acute phase of infection. The lowest concentration of SARS-CoV-2 viral copies this assay can detect is 250 copies / mL. A negative result does not preclude SARS-CoV-2 infection and should not be used as the sole basis for treatment or other patient management decisions.  A negative result may occur with improper specimen collection / handling, submission of specimen other than nasopharyngeal swab, presence of viral mutation(s) within the areas targeted by this assay, and inadequate number of viral copies (<250 copies / mL). A negative result must be combined with clinical observations, patient history, and epidemiological information.  Fact Sheet for Patients:   StrictlyIdeas.no  Fact Sheet for Healthcare Providers: BankingDealers.co.za  This test is not yet approved or  cleared by the [place] FDA and has been authorized for detection and/or diagnosis of SARS-CoV-2 by FDA under an Emergency Use Authorization (EUA).  This EUA will remain in effect (meaning this test can be used) for the duration of the COVID-19 declaration under Section 564(b)(1) of the Act, 21 U.S.C. section 360bbb-3(b)(1), unless the authorization is terminated or revoked sooner.  Performed at [facility], [address] [place], [place] [address]   MRSA PCR Screening     Status: None   Collection Time: 07/14/19 12:27 AM  Result Value Ref Range Status   MRSA by PCR NEGATIVE NEGATIVE  Final    Comment:        The GeneXpert MRSA Assay (FDA approved for NASAL specimens only), is one component of a comprehensive MRSA colonization surveillance program. It is not intended to diagnose MRSA infection nor to guide or monitor treatment for MRSA infections. Performed at [facility], [place] [address] [place], [place] [address]   Culture, blood (routine x 2)     Status: None (Preliminary result)   Collection Time: 07/17/19  5:31 PM   Specimen: BLOOD  Result Value Ref Range Status   Specimen Description BLOOD RIGHT ANTECUBITAL  Final   Special Requests   Final    BOTTLES DRAWN AEROBIC ONLY Blood Culture adequate volume   Culture   Final    NO GROWTH < 12 HOURS Performed at [facility], [place] [address] [place], [place] [address]    Report Status PENDING  Incomplete  Culture, blood (routine x 2)     Status: None (Preliminary result)   Collection Time: 07/17/19  5:38 PM   Specimen: BLOOD LEFT WRIST  Result Value Ref Range Status   Specimen Description BLOOD LEFT WRIST  Final   Special Requests   Final    BOTTLES DRAWN AEROBIC ONLY Blood Culture adequate volume   Culture   Final    NO GROWTH < 12 HOURS Performed at [facility], [place] [address] [place],  [address]    Report Status PENDING  Incomplete         Radiology Studies: CT HEAD WO CONTRAST  Result Date: 07/17/2019 CLINICAL DATA:  Generalized weakness. EXAM: CT HEAD WITHOUT CONTRAST TECHNIQUE: Contiguous axial images were obtained from the base of the skull through the vertex without intravenous contrast.  COMPARISON:  None. FINDINGS: Brain: There is no mass, hemorrhage or extra-axial collection. The size and configuration of the ventricles and extra-axial CSF spaces are normal. There is hypoattenuation of the white matter, most commonly indicating chronic small vessel disease. Vascular: No abnormal hyperdensity of the major intracranial arteries or dural venous sinuses. No intracranial  atherosclerosis. Skull: The visualized skull base, calvarium and extracranial soft tissues are normal. Sinuses/Orbits: No fluid levels or advanced mucosal thickening of the visualized paranasal sinuses. No mastoid or middle ear effusion. The orbits are normal. IMPRESSION: Chronic small vessel disease without acute intracranial abnormality. Electronically Signed   By: Ulyses Jarred M.D.   On: 07/17/2019 20:22   DG Abd Portable 1V  Result Date: 07/17/2019 CLINICAL DATA:  Evaluate for possible constipation EXAM: PORTABLE ABDOMEN - 1 VIEW COMPARISON:  None. FINDINGS: Scattered large and small bowel gas is noted. No significant retained fecal material is noted to suggest constipation. No free air is noted. No mass lesion is seen. Bony structures appear within normal limits. IMPRESSION: No acute abnormality noted. Electronically Signed   By: Inez Catalina M.D.   On: 07/17/2019 16:58        Scheduled Meds: . amLODipine  10 mg Oral Daily  . aspirin EC  81 mg Oral Daily  . Chlorhexidine Gluconate Cloth  6 each Topical Q0600  . cholecalciferol  1,000 Units Oral Q1400  . clopidogrel  75 mg Oral Daily  . darbepoetin (ARANESP) injection - NON-DIALYSIS  60 mcg Subcutaneous Q Thu-1800  . febuxostat  40 mg Oral Q1400  . feeding supplement (NEPRO CARB STEADY)  237 mL Oral BID BM  . insulin aspart  0-6 Units Subcutaneous TID WC  . insulin aspart  2 Units Subcutaneous TID WC  . insulin detemir  2 Units Subcutaneous QHS  . multivitamin  1 tablet Oral QHS  . sodium bicarbonate  1,300 mg Oral BID  . sodium chloride flush  3 mL Intravenous Q12H  . sodium chloride flush  3 mL Intravenous Q12H   Continuous Infusions: . sodium chloride    . sodium chloride    . sodium chloride    . cefTRIAXone (ROCEPHIN)  IV 1 g (07/19/19 0932)     LOS: 5 days    Time spent:40 min    Taleia Sadowski, Geraldo Docker, MD [facility] Pager [phone]  If 7PM-7AM, please contact night-coverage [email or web address] Password  [facility] 07/19/2019, 10:29 AM

## 2019-07-20 LAB — COMPREHENSIVE METABOLIC PANEL
ALT: 21 U/L (ref 0–44)
AST: 26 U/L (ref 15–41)
Albumin: 2.4 g/dL — ABNORMAL LOW (ref 3.5–5.0)
Alkaline Phosphatase: 47 U/L (ref 38–126)
Anion gap: 9 (ref 5–15)
BUN: 9 mg/dL (ref 6–20)
CO2: 26 mmol/L (ref 22–32)
Calcium: 8.3 mg/dL — ABNORMAL LOW (ref 8.9–10.3)
Chloride: 99 mmol/L (ref 98–111)
Creatinine, Ser: 2.56 mg/dL — ABNORMAL HIGH (ref 0.44–1.00)
GFR calc Af Amer: 23 mL/min — ABNORMAL LOW (ref >60)
GFR calc non Af Amer: 20 mL/min — ABNORMAL LOW (ref >60)
Glucose, Bld: 98 mg/dL (ref 70–99)
Potassium: 3.1 mmol/L — ABNORMAL LOW (ref 3.5–5.1)
Sodium: 134 mmol/L — ABNORMAL LOW (ref 135–145)
Total Bilirubin: 0.7 mg/dL (ref 0.3–1.2)
Total Protein: 4.8 g/dL — ABNORMAL LOW (ref 6.5–8.1)

## 2019-07-20 LAB — CBC WITH DIFFERENTIAL/PLATELET
Abs Immature Granulocytes: 0.07 10*3/uL (ref 0.00–0.07)
Basophils Absolute: 0.1 10*3/uL (ref 0.0–0.1)
Basophils Relative: 1 %
Eosinophils Absolute: 0.1 10*3/uL (ref 0.0–0.5)
Eosinophils Relative: 1 %
HCT: 25.5 % — ABNORMAL LOW (ref 36.0–46.0)
Hemoglobin: 8 g/dL — ABNORMAL LOW (ref 12.0–15.0)
Immature Granulocytes: 1 %
Lymphocytes Relative: 12 %
Lymphs Abs: 0.8 10*3/uL (ref 0.7–4.0)
MCH: 19.8 pg — ABNORMAL LOW (ref 26.0–34.0)
MCHC: 31.4 g/dL (ref 30.0–36.0)
MCV: 63 fL — ABNORMAL LOW (ref 80.0–100.0)
Monocytes Absolute: 0.6 10*3/uL (ref 0.1–1.0)
Monocytes Relative: 10 %
Neutro Abs: 4.8 10*3/uL (ref 1.7–7.7)
Neutrophils Relative %: 75 %
Platelets: 73 10*3/uL — ABNORMAL LOW (ref 150–400)
RBC: 4.05 MIL/uL (ref 3.87–5.11)
RDW: 17 % — ABNORMAL HIGH (ref 11.5–15.5)
WBC: 6.3 10*3/uL (ref 4.0–10.5)
nRBC: 1.1 % — ABNORMAL HIGH (ref 0.0–0.2)

## 2019-07-20 LAB — URINE CULTURE: Culture: <10000 — AB

## 2019-07-20 LAB — GLUCOSE, CAPILLARY
Glucose-Capillary: 65 mg/dL — ABNORMAL LOW (ref 70–99)
Glucose-Capillary: 94 mg/dL (ref 70–99)
Glucose-Capillary: 107 mg/dL — ABNORMAL HIGH (ref 70–99)
Glucose-Capillary: 96 mg/dL (ref 70–99)
Glucose-Capillary: 252 mg/dL — ABNORMAL HIGH (ref 70–99)

## 2019-07-20 LAB — HEPATITIS B CORE ANTIBODY, TOTAL: Hep B Core Total Ab: REACTIVE — AB

## 2019-07-20 LAB — PHOSPHORUS: Phosphorus: 2.4 mg/dL — ABNORMAL LOW (ref 2.5–4.6)

## 2019-07-20 LAB — MAGNESIUM
Magnesium: 1.8 mg/dL (ref 1.7–2.4)
Magnesium: 1.7 mg/dL (ref 1.7–2.4)

## 2019-07-20 MED ORDER — MAGNESIUM SULFATE 2 GM/50ML IV SOLN
2.0000 g | Freq: Once | INTRAVENOUS | Status: AC
  Administered 2019-07-21: 2 g via INTRAVENOUS
  Filled 2019-07-20: qty 50

## 2019-07-20 MED ORDER — CALCIUM CARBONATE 1250 (500 CA) MG PO TABS
2.0000 | ORAL_TABLET | Freq: Once | ORAL | Status: AC
  Administered 2019-07-20: 1000 mg via ORAL
  Filled 2019-07-20: qty 2

## 2019-07-20 MED ORDER — POTASSIUM CHLORIDE CRYS ER 20 MEQ PO TBCR
40.0000 meq | EXTENDED_RELEASE_TABLET | Freq: Once | ORAL | Status: AC
  Administered 2019-07-20: 40 meq via ORAL
  Filled 2019-07-20: qty 2

## 2019-07-20 MED ORDER — CALCIUM GLUCONATE-NACL 1-0.675 GM/50ML-% IV SOLN
1.0000 g | Freq: Once | INTRAVENOUS | Status: AC
  Administered 2019-07-20: 1000 mg via INTRAVENOUS
  Filled 2019-07-20: qty 50

## 2019-07-20 NOTE — Progress Notes (Signed)
Pt arrived back to the unit. Pt not in distress and tolerated well. 

## 2019-07-20 NOTE — Progress Notes (Addendum)
PROGRESS NOTE    Crystal Jacobs  BOF:[id number] DOB: July 04, 1960 DOA: 07/13/2019 PCP: Patient, No Pcp Per     Brief Narrative:  59 y.o. female from [place] PMHx DM type II uncontrolled with complication, HTN, CKD stage V, s/p Nephrectomy who presented to [place] ED for evaluation of generalized weakness.  History is supplemented by daughter at bedside.  About 2 weeks ago patient began to feel generally weak.  She had been having constipation and about 1 week ago she took Dulcolax.  The following day she had 9 bowel movements which have reportedly normalized since then.  She has however been feeling very weak and unable to perform the usual amount of activity she is used to.  She says she has had poor appetite and low oral intake.  She has had some nausea and vomiting.  She says she has a history of CKD stage V weight loss reported creatinine 7.7 in April 2021.  She says she is makes a small amount of urine without dysuria.  She says she usually gets injections to keep her blood counts up.  She denies any obvious bleeding.  She says she now again has decreased bowel movements than expected.  She is visiting from [place] and arrived 1 month ago.  She has been on a low-sodium diet.  She has a medication list which reads as follows:  Lantus 8 units once a day Novorapid 6 units daily Pantoprazole Multivitamin Minipress XL 5 mg twice daily Sodium bicarb 500 twice daily Febuxostat40 mg daily for gout Clopitab (Plavix/ASA 75/10) daily Cilnidipine 10/50 9 am and 9 pm   Subjective: 6/26 afebrile overnight, A/O x4.  Unable to fully evaluate patient as son monopolized conversation   Assessment & Plan: Covid vaccination; +1/2 vaccination    Principal Problem:   Hyponatremia Active Problems:   CKD (chronic kidney disease), stage V (HCC)   Microcytic anemia   Thrombocytopenia (HCC)   Insulin dependent type 2 diabetes mellitus ([place])   Hypochloremia  Acute altered mental  status -Per daughter even though sodium has mostly corrected patient's mental status has worsened in the past 24 to 48 hours.  States mother is talking gibberish unable to answer question coherently.  This a.m. combative unable to coherently answer questions. -CT head negative for acute findings see results below -Ammonia WNL -Pan culture negative to date -ABG does not account for patient's altered mental status. -6/24 patient has obtain head CT decrease Haldol 1 mg BID. PRN -6/24 NO MEDICATION FOR SLEEPING.  PRIMARY TEAM only to change medication. -6/25 resolving  Severe hypovolemic Hyponatremia/Hypochloremia: -Hold all patient's sodium has improved mental status has not in fact has worsened.   Recent Labs  Lab 07/17/19 1731 07/17/19 2232 07/18/19 0435 07/19/19 0401 07/20/19 0408  NA 128* 130* 128* 135 134*  -Per daughter who has been at her bedside patient's mental status has continued to deteriorate in the last 48 to 24 hours. -6/24 per daughter patient is improving cognition wise but not quite back to baseline.  I agree after exam that her cognition has improved today. -6/25 resolved  Essential HTN -6/23 Amlodipine 10 mg daily -6/23 Hydralazine PRN -6/24 normalize BP.  CT shows negative stroke  -6/25 BP slightly elevated however given that patient is on HD would not alter at this time.  CKD (chronic kidney disease), stage V ([place]) -Patient has a fistula in the left arm with a good thrill, the daughter relates that her creatinine usually ranges around 7. -6/24 first  day of HD Recent Labs  Lab 07/17/19 1731 07/17/19 2232 07/18/19 0435 07/19/19 0401 07/20/19 0408  CREATININE 5.87* 6.06* 6.02* 3.71* 2.56*    Chronic thrombocytopenia: -At home she takes aspirin and Plavix. -Thrombocytopenia continues to worsen.  Although may be reactive could also be secondary to heparin. Will discontinue heparin -6/23 HIT panel pending -Low but stable  Microcytic anemia anemia of  chronic renal disease: She states she has been receiving erythropoietin as an outpatient. Anemia panel is pending, her current hemoglobin is 12.5. Recent Labs  Lab 07/14/19 0205 07/17/19 0516 07/18/19 0435 07/19/19 0401 07/20/19 0408  HGB 12.2 10.9* 8.8* 8.7* 8.0*  -Hemoccult pending  DM type II controlled with complication -1/61 hemoglobin A1c = 6.2  -Levemir 2 units daily -NovoLog 2 units qac  Agitation/Anxiety -6/24 Haldol 2 mg BID PRN  Metabolic acidosis? -Resolved  Hypokalemia/Hyperkalemia: -Hypokalemia K. Dur 40 mEq  Hypomagnesmia -Magnesium IV 2 g  Underweight: Continue her renal diet.  Bacteriuria -6/24 start empiric antibiotics, urine is dirty.  Urine culture pending  Chronic hepatitis B??? -Hepatitis B surface antigen/core antibody positive -6/26 will obtain hepatitis B surface antibody -6/26 obtain hepatitis B viral load  Goals of care -6/26 son and to some extent daughter cannot understand what has occurred, and have unrealistic expectations. -Each day the son asks the exact same questions in a slightly different manner.  -Son cannot understand that there is no single reason for patient's ONLY remaining kidney to be failing and therefore patient requires hemodialysis to survive.  This has been explained to patient in detail by myself and Dr. Hollie Salk nephrologist.  -Son unable to understand that since there is no SINGLE cause of his mother's cognitive decline, there is no simple treatment plan that will immediately resolve all of her issues.  -Son revealed to me today 6/26 that family had been intentionally withholding sodium, calcium, magnesium, potassium from patient's diet.  Son unable to understand that these are ABSOLUTELY CRITICAL elements for the body to function correctly.  -Sons unrealistic expectations have led him to believe that patient can obtain kidney transplant in a short period of time, that hemodialysis with only 2 or 3 days of treatment  will return patient to baseline even though family admits patient has been deteriorating over a couple of weeks minimal.  -Patient's cognition has significantly improved with minimal hemodialysis however patient needs to continue.  In order to obtain maximum benefit.  Son and daughter have decided to terminate any future HD.  -Family unable to understand that a medical treatment plan is dynamic, and believe that any plan that may have been discussed should be written in stone..  -Dr. Hollie Salk nephrology and myself have spent extensive amount of time trying to explain the necessity of the treatment plan.  Had to inform son today that it was impossible to explain the whole pathophysiology of the human body and why it needed to have all of these elements to run and that the kidney was extremely important for management of these elements and for filtration of toxins.  Counseled family they needed to trust that if Dr. Hollie Salk and myself agreed on a treatment plan and that it was in the best interest of the patient, however it was in their rights to refuse treatment..  -Son and daughter have decided that today's HD treatment will be patient's last against the advice of myself and Dr. Hollie Salk.  We will speak with family in the A.m.  -Will consult palliative care for goals of care;  home hospice vs residential hospice.       DVT prophylaxis: SCD Code Status: Full Family Communication: 6/24 spoke with daughter and son discussed at length with them that there is usually not just one because for problem such as her mother is having.  Son is very keen on pinning now just 1 reason for his mother being ill and having cognitive changes.  Again discussed at length reason for requiring hemodialysis.  Brother and daughter inquired into if they wanted to stop hemodialysis what other risk factors and I informed them there was a risk of DEATH.  Brother and sister decided that today would be the last day of HD.  I informed Dr. Hollie Salk  nephrology of this family decision Status is: Inpatient    Dispo: The patient is from: Home              Anticipated d/c is to: Home              Anticipated d/c date is: 6/30              Patient currently unstable      Consultants:  Nephrology Dr. Hollie Salk   Procedures/Significant Events:  6/24 CT head W0 contrast; chronic small vessel disease without acute intracranial abnormality    I have personally reviewed and interpreted all radiology studies and my findings are as above.  VENTILATOR SETTINGS:    Cultures 6/19 SARS coronavirus negative 6/20 MRSA by PCR negative 6/20 HIV nonreactive 6/23 blood RIGHT AC NGTD 6/23 blood LEFT wrist NGTD 6/24 urine pending 6/24 hepatitis B surface antigen positive 6/24 hepatitis B core total antibody positive    Antimicrobials: Anti-infectives (From admission, onward)   Start     Ordered Stop   07/18/19 1000  cefTRIAXone (ROCEPHIN) 1 g in sodium chloride 0.9 % 100 mL IVPB  Status:  Discontinued        07/18/19 0905 07/20/19 1230       Devices    LINES / TUBES:      Continuous Infusions: . sodium chloride 250 mL (07/19/19 1721)  . sodium chloride    . sodium chloride    . cefTRIAXone (ROCEPHIN)  IV 1 g (07/19/19 0932)     Objective: Vitals:   07/19/19 1954 07/20/19 0354 07/20/19 0500 07/20/19 0905  BP: 133/63 137/61  (!) 150/55  Pulse: 91 78  75  Resp: 17 17  17   Temp: 98.5 F (36.9 C) (!) 97.5 F (36.4 C)  97.8 F (36.6 C)  TempSrc: Oral Oral  Oral  SpO2: 100% 100%  100%  Weight:   45.9 kg   Height:        Intake/Output Summary (Last 24 hours) at 07/20/2019 1014 Last data filed at 07/20/2019 0600 Gross per 24 hour  Intake 976.53 ml  Output 0 ml  Net 976.53 ml   Filed Weights   07/19/19 1357 07/19/19 1615 07/20/19 0500  Weight: 45.7 kg 45.4 kg 45.9 kg   Physical Exam:  General: A/O X4, no acute respiratory distress Eyes: negative scleral hemorrhage, negative anisocoria, negative  icterus ENT: Negative Runny nose, negative gingival bleeding, Neck:  Negative scars, masses, torticollis, lymphadenopathy, JVD Lungs: Clear to auscultation bilaterally without wheezes or crackles Cardiovascular: Regular rate and rhythm without murmur gallop or rub normal S1 and S2 Abdomen: negative abdominal pain, nondistended, positive soft, bowel sounds, no rebound, no ascites, no appreciable mass Extremities: No significant cyanosis, clubbing, or edema bilateral lower extremities Skin: Negative rashes, lesions, ulcers Psychiatric:  Negative depression, negative anxiety, negative fatigue, negative mania  Central nervous system:  Cranial nerves II through XII intact, tongue/uvula midline, all extremities muscle strength 5/5, sensation intact throughout, negative dysarthria, negative expressive aphasia, negative receptive aphasia.  .     Data Reviewed: Care during the described time interval was provided by me .  I have reviewed this patient's available data, including medical history, events of note, physical examination, and all test results as part of my evaluation.  CBC: Recent Labs  Lab 07/13/19 2101 07/13/19 2101 07/14/19 0205 07/17/19 0516 07/18/19 0435 07/19/19 0401 07/20/19 0408  WBC 12.9*   < > 12.0* 8.3 8.2 7.3 6.3  NEUTROABS 10.8*  --   --   --  6.9 5.5 4.8  HGB 12.5   < > 12.2 10.9* 8.8* 8.7* 8.0*  HCT 38.5   < > 37.0 34.8* 27.8* 27.6* 25.5*  MCV 60.4*   < > 59.7* 62.6* 62.2* 62.3* 63.0*  PLT 108*   < > 95* 82* 83* 83* 73*   < > = values in this interval not displayed.   Basic Metabolic Panel: Recent Labs  Lab 07/17/19 1731 07/17/19 2232 07/18/19 0435 07/19/19 0401 07/20/19 0408  NA 128* 130* 128* 135 134*  K 4.5 5.0 4.6 3.5 3.1*  CL 97* 97* 98 99 99  CO2 20* 22 21* 25 26  GLUCOSE 173* 206* 143* 77 98  BUN 34* 37* 37* 18 9  CREATININE 5.87* 6.06* 6.02* 3.71* 2.56*  CALCIUM 9.2 9.6 8.9 8.8* 8.3*  MG  --   --  2.2 2.0 1.8  PHOS 3.7 3.8 3.9 3.5 2.4*    GFR: Estimated Creatinine Clearance: 17.8 mL/min (A) (by C-G formula based on SCr of 2.56 mg/dL (H)). Liver Function Tests: Recent Labs  Lab 07/13/19 2101 07/14/19 0907 07/17/19 1352 07/17/19 1352 07/17/19 1731 07/17/19 2232 07/18/19 0435 07/19/19 0401 07/20/19 0408  AST 37  --  40  --   --   --  40 24 26  ALT 29  --  22  --   --   --  18 21 21   ALKPHOS 73  --  47  --   --   --  45 45 47  BILITOT 1.5*  --  1.0  --   --   --  0.8 0.7 0.7  PROT 7.0  --  5.2*  --   --   --  5.1* 5.1* 4.8*  ALBUMIN 3.7   < > 2.8*   < > 3.0* 3.1* 2.7* 2.7* 2.4*   < > = values in this interval not displayed.   Recent Labs  Lab 07/13/19 2101  LIPASE 47   Recent Labs  Lab 07/17/19 1731  AMMONIA 22   Coagulation Profile: No results for input(s): INR, PROTIME in the last 168 hours. Cardiac Enzymes: No results for input(s): CKTOTAL, CKMB, CKMBINDEX, TROPONINI in the last 168 hours. BNP (last 3 results) No results for input(s): PROBNP in the last 8760 hours. HbA1C: No results for input(s): HGBA1C in the last 72 hours. CBG: Recent Labs  Lab 07/19/19 1200 07/19/19 1719 07/19/19 1955 07/20/19 0658 07/20/19 0751  GLUCAP 169* 85 127* 65* 94   Lipid Profile: No results for input(s): CHOL, HDL, LDLCALC, TRIG, CHOLHDL, LDLDIRECT in the last 72 hours. Thyroid Function Tests: No results for input(s): TSH, T4TOTAL, FREET4, T3FREE, THYROIDAB in the last 72 hours. Anemia Panel: Recent Labs    07/18/19 1002  FERRITIN 1,582*  TIBC 157*  IRON  142   Sepsis Labs: No results for input(s): PROCALCITON, LATICACIDVEN in the last 168 hours.  Recent Results (from the past 240 hour(s))  SARS Coronavirus 2 by RT PCR (hospital order, performed in [facility] hospital lab) Nasopharyngeal Nasopharyngeal Swab     Status: None   Collection Time: 07/13/19 10:30 PM   Specimen: Nasopharyngeal Swab  Result Value Ref Range Status   SARS Coronavirus 2 NEGATIVE NEGATIVE Final    Comment: (NOTE) SARS-CoV-2  target nucleic acids are NOT DETECTED.  The SARS-CoV-2 RNA is generally detectable in upper and lower respiratory specimens during the acute phase of infection. The lowest concentration of SARS-CoV-2 viral copies this assay can detect is 250 copies / mL. A negative result does not preclude SARS-CoV-2 infection and should not be used as the sole basis for treatment or other patient management decisions.  A negative result may occur with improper specimen collection / handling, submission of specimen other than nasopharyngeal swab, presence of viral mutation(s) within the areas targeted by this assay, and inadequate number of viral copies (<250 copies / mL). A negative result must be combined with clinical observations, patient history, and epidemiological information.  Fact Sheet for Patients:   StrictlyIdeas.no  Fact Sheet for Healthcare Providers: BankingDealers.co.za  This test is not yet approved or  cleared by the [place] FDA and has been authorized for detection and/or diagnosis of SARS-CoV-2 by FDA under an Emergency Use Authorization (EUA).  This EUA will remain in effect (meaning this test can be used) for the duration of the COVID-19 declaration under Section 564(b)(1) of the Act, 21 U.S.C. section 360bbb-3(b)(1), unless the authorization is terminated or revoked sooner.  Performed at [facility], [address] [place], [place] [address]   MRSA PCR Screening     Status: None   Collection Time: 07/14/19 12:27 AM  Result Value Ref Range Status   MRSA by PCR NEGATIVE NEGATIVE Final    Comment:        The GeneXpert MRSA Assay (FDA approved for NASAL specimens only), is one component of a comprehensive MRSA colonization surveillance program. It is not intended to diagnose MRSA infection nor to guide or monitor treatment for MRSA infections. Performed at [facility], [place] [address]  [place], [place] [address]   Culture, blood (routine x 2)     Status: None (Preliminary result)   Collection Time: 07/17/19  5:31 PM   Specimen: BLOOD  Result Value Ref Range Status   Specimen Description BLOOD RIGHT ANTECUBITAL  Final   Special Requests   Final    BOTTLES DRAWN AEROBIC ONLY Blood Culture adequate volume   Culture   Final    NO GROWTH 3 DAYS Performed at [facility], [place] [address] [place], [place] [address]    Report Status PENDING  Incomplete  Culture, blood (routine x 2)     Status: None (Preliminary result)   Collection Time: 07/17/19  5:38 PM   Specimen: BLOOD LEFT WRIST  Result Value Ref Range Status   Specimen Description BLOOD LEFT WRIST  Final   Special Requests   Final    BOTTLES DRAWN AEROBIC ONLY Blood Culture adequate volume   Culture   Final    NO GROWTH 3 DAYS Performed at [facility], [place] [address], [place], [place] [address]    Report Status PENDING  Incomplete         Radiology Studies: No results found.  Scheduled Meds: . amLODipine  10 mg Oral Daily  . aspirin EC  81 mg Oral Daily  . Chlorhexidine Gluconate Cloth  6 each Topical Q0600  . cholecalciferol  1,000 Units Oral Q1400  . clopidogrel  75 mg Oral Daily  . darbepoetin (ARANESP) injection - NON-DIALYSIS  60 mcg Subcutaneous Q Thu-1800  . febuxostat  40 mg Oral Q1400  . feeding supplement (NEPRO CARB STEADY)  237 mL Oral BID BM  . insulin aspart  0-6 Units Subcutaneous TID WC  . insulin aspart  2 Units Subcutaneous TID WC  . insulin detemir  2 Units Subcutaneous QHS  . multivitamin  1 tablet Oral QHS  . sodium bicarbonate  1,300 mg Oral BID  . sodium chloride flush  3 mL Intravenous Q12H  . sodium chloride flush  3 mL Intravenous Q12H   Continuous Infusions: . sodium chloride 250 mL (07/19/19 1721)  . sodium chloride    . sodium chloride    . cefTRIAXone (ROCEPHIN)  IV 1 g (07/19/19 0932)     LOS: 6 days    Time spent:40 min    Ahad Colarusso, Geraldo Docker, MD [facility] Pager [phone]  If 7PM-7AM, please contact night-coverage [email or web address] Password [email or web address] 07/20/2019, 10:14 AM

## 2019-07-20 NOTE — Progress Notes (Addendum)
[facility] Progress Note    Assessment/ Plan:   1. Hyponatremia - Na+ 106 on admit 6/19 w/ orthostatic symptoms and recent N/V, diarrhea.  Hypovolemic. Na + improving w/ normal saline up to 117 6/20, s/p D5W at 50/hr x 4 hrs and stabilized, then NS resumed at 75/ hr.  Na getting better, 123--> 126--> 129--> 130-->128.  Now normalized with HD, stop IVFs  2. 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  Creat 6.5 > 5.5-- 6.2 here w/ IVF's.  Discussions with pt and son.  Now that we are correcting Na, pt still has poor appetite and + asterixis.HD #1 6/24, HD #2 6/25.  Improvement in symptoms with HD..  Questions answered re: kidney transplant, home HD, and if she even does need to continue dialysis beyond the hospital (my recommendation is yes).  Renal navigator has been notified.  Will ask SW to help with figuring out what insurance covers (has traveler's insurance).  Reiterated today that pausing dialysis and observing what kidney toxins do is not advisable--> given that she is ESRD she has a risk of complications from uremic toxins.  Have discussed in detail (n/v, itching, poor appetite, dysgeusia, uremic pericarditis, coma, death).   3. Acute encephalopathy: Improving. CT head negative, ABG OK, ammonia OK, AXR without constipation, blood cultures neg.  UA on admission with bacteruria, empirically rx with Rocephin and reculture--> in process.  Given mild twitching, will stop ceftriaxone, K supps given, Ca OK but lower than previously, will give 1 g Ca gluconate. Mg ok.  Not altered during, doesn't look like seizures.  4. Hep B +: S Ag positive, SAB, CAB, Hep B e, HBV PCR in process  5. Hypovolemia - improving, orthostatics + but overall hypertensive, amlodipine restarted, also on metop and minipress at home  6. HyperK- resolved  7. IDDM - per primary team  7.  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 Dispo: if it ends up that  pt is ESRD, improves with dialysis, and needs this rx--> pt's children would like to have her do HHD.  Have reached out to [facility] manager to see if hospital discharge to home training is even possible.  Subjective:    HD #2 yesterday, for HD #3 today.  Having trouble holding her drink cup today.   Objective:   BP (!) 150/55 (BP Location: Right Arm)    Pulse 75    Temp 97.8 F (36.6 C) (Oral)    Resp 17    Ht 5\' 2"  (1.575 m)    Wt 45.9 kg    LMP  (LMP Unknown)    SpO2 100%    BMI 18.51 kg/m   Intake/Output Summary (Last 24 hours) at 07/20/2019 1144 Last data filed at 07/20/2019 0600 Gross per 24 hour  Intake 976.53 ml  Output 0 ml  Net 976.53 ml   Weight change: 1.1 kg  Physical Exam: Gen: NAD, lying in bed, NAD CVS: RRR Resp: clear  Abd: soft Ext: no LE edema ACCESS: LUE AVF + T/B NEURO: AAOx 3, tremulous when holding drink cup but no frank asterixis  Imaging: No results found.  Labs: BMET Recent Labs  Lab 07/17/19 0516 07/17/19 1352 07/17/19 1731 07/17/19 2232 07/18/19 0435 07/19/19 0401 07/20/19 0408  NA 129* 129* 128* 130* 128* 135 134*  K 4.3 5.5* 4.5 5.0 4.6 3.5 3.1*  CL 97* 100 97* 97* 98 99 99  CO2 18* 17* 20* 22 21*  25 26  GLUCOSE 114* 152* 173* 206* 143* 77 98  BUN 37* 39* 34* 37* 37* 18 9  CREATININE 5.91* 5.93* 5.87* 6.06* 6.02* 3.71* 2.56*  CALCIUM 9.4 8.8* 9.2 9.6 8.9 8.8* 8.3*  PHOS 3.4 3.5 3.7 3.8 3.9 3.5 2.4*   CBC Recent Labs  Lab 07/13/19 2101 07/14/19 0205 07/17/19 0516 07/18/19 0435 07/19/19 0401 07/20/19 0408  WBC 12.9*   < > 8.3 8.2 7.3 6.3  NEUTROABS 10.8*  --   --  6.9 5.5 4.8  HGB 12.5   < > 10.9* 8.8* 8.7* 8.0*  HCT 38.5   < > 34.8* 27.8* 27.6* 25.5*  MCV 60.4*   < > 62.6* 62.2* 62.3* 63.0*  PLT 108*   < > 82* 83* 83* 73*   < > = values in this interval not displayed.    Medications:     amLODipine  10 mg Oral Daily   aspirin EC  81 mg Oral Daily   Chlorhexidine Gluconate Cloth  6 each Topical Q0600   cholecalciferol   1,000 Units Oral Q1400   clopidogrel  75 mg Oral Daily   darbepoetin (ARANESP) injection - NON-DIALYSIS  60 mcg Subcutaneous Q Thu-1800   febuxostat  40 mg Oral Q1400   feeding supplement (NEPRO CARB STEADY)  237 mL Oral BID BM   insulin aspart  0-6 Units Subcutaneous TID WC   insulin aspart  2 Units Subcutaneous TID WC   insulin detemir  2 Units Subcutaneous QHS   multivitamin  1 tablet Oral QHS   sodium chloride flush  3 mL Intravenous Q12H   sodium chloride flush  3 mL Intravenous Q12H      Crystal Lips, MD 07/20/2019, 11:44 AM

## 2019-07-20 NOTE — Progress Notes (Signed)
Orthostatic vital signs not completed this am due to pt.'s weakness.

## 2019-07-21 DIAGNOSIS — B181 Chronic viral hepatitis B without delta-agent: Secondary | ICD-10-CM

## 2019-07-21 LAB — CBC WITH DIFFERENTIAL/PLATELET
Abs Immature Granulocytes: 0.06 10*3/uL (ref 0.00–0.07)
Basophils Absolute: 0 10*3/uL (ref 0.0–0.1)
Basophils Relative: 1 %
Eosinophils Absolute: 0 10*3/uL (ref 0.0–0.5)
Eosinophils Relative: 1 %
HCT: 26.6 % — ABNORMAL LOW (ref 36.0–46.0)
Hemoglobin: 8.4 g/dL — ABNORMAL LOW (ref 12.0–15.0)
Immature Granulocytes: 1 %
Lymphocytes Relative: 13 %
Lymphs Abs: 0.8 10*3/uL (ref 0.7–4.0)
MCH: 20.2 pg — ABNORMAL LOW (ref 26.0–34.0)
MCHC: 31.6 g/dL (ref 30.0–36.0)
MCV: 63.9 fL — ABNORMAL LOW (ref 80.0–100.0)
Monocytes Absolute: 0.7 10*3/uL (ref 0.1–1.0)
Monocytes Relative: 11 %
Neutro Abs: 4.8 10*3/uL (ref 1.7–7.7)
Neutrophils Relative %: 73 %
Platelets: 77 10*3/uL — ABNORMAL LOW (ref 150–400)
RBC: 4.16 MIL/uL (ref 3.87–5.11)
RDW: 17.4 % — ABNORMAL HIGH (ref 11.5–15.5)
WBC: 6.4 10*3/uL (ref 4.0–10.5)
nRBC: 0.8 % — ABNORMAL HIGH (ref 0.0–0.2)

## 2019-07-21 LAB — COMPREHENSIVE METABOLIC PANEL
ALT: 22 U/L (ref 0–44)
AST: 25 U/L (ref 15–41)
Albumin: 2.5 g/dL — ABNORMAL LOW (ref 3.5–5.0)
Alkaline Phosphatase: 43 U/L (ref 38–126)
Anion gap: 6 (ref 5–15)
BUN: 5 mg/dL — ABNORMAL LOW (ref 6–20)
CO2: 30 mmol/L (ref 22–32)
Calcium: 8.7 mg/dL — ABNORMAL LOW (ref 8.9–10.3)
Chloride: 99 mmol/L (ref 98–111)
Creatinine, Ser: 1.73 mg/dL — ABNORMAL HIGH (ref 0.44–1.00)
GFR calc Af Amer: 38 mL/min — ABNORMAL LOW (ref >60)
GFR calc non Af Amer: 32 mL/min — ABNORMAL LOW (ref >60)
Glucose, Bld: 135 mg/dL — ABNORMAL HIGH (ref 70–99)
Potassium: 3.9 mmol/L (ref 3.5–5.1)
Sodium: 135 mmol/L (ref 135–145)
Total Bilirubin: 0.8 mg/dL (ref 0.3–1.2)
Total Protein: 5 g/dL — ABNORMAL LOW (ref 6.5–8.1)

## 2019-07-21 LAB — HEPATITIS B SURFACE ANTIBODY, QUANTITATIVE: Hep B S AB Quant (Post): <3.1 m[IU]/mL — ABNORMAL LOW (ref >9.9)

## 2019-07-21 LAB — MAGNESIUM: Magnesium: 2.3 mg/dL (ref 1.7–2.4)

## 2019-07-21 LAB — GLUCOSE, CAPILLARY
Glucose-Capillary: 101 mg/dL — ABNORMAL HIGH (ref 70–99)
Glucose-Capillary: 100 mg/dL — ABNORMAL HIGH (ref 70–99)
Glucose-Capillary: 259 mg/dL — ABNORMAL HIGH (ref 70–99)
Glucose-Capillary: 326 mg/dL — ABNORMAL HIGH (ref 70–99)
Glucose-Capillary: 229 mg/dL — ABNORMAL HIGH (ref 70–99)

## 2019-07-21 LAB — HEPATITIS B DNA, ULTRAQUANTITATIVE, PCR
HBV DNA SERPL PCR-ACNC: 14200 IU/mL
HBV DNA SERPL PCR-LOG IU: 4.152 log10 IU/mL

## 2019-07-21 LAB — HEPATITIS B E ANTIGEN: Hep B E Ag: NEGATIVE

## 2019-07-21 LAB — PHOSPHORUS: Phosphorus: 1.7 mg/dL — ABNORMAL LOW (ref 2.5–4.6)

## 2019-07-21 MED ORDER — METOPROLOL TARTRATE 25 MG PO TABS
12.5000 mg | ORAL_TABLET | Freq: Two times a day (BID) | ORAL | Status: DC
  Administered 2019-07-21 – 2019-07-25 (×7): 12.5 mg via ORAL
  Filled 2019-07-21 (×8): qty 1

## 2019-07-21 MED ORDER — SODIUM PHOSPHATES 45 MMOLE/15ML IV SOLN
20.0000 mmol | Freq: Once | INTRAVENOUS | Status: AC
  Administered 2019-07-21: 20 mmol via INTRAVENOUS
  Filled 2019-07-21: qty 6.67

## 2019-07-21 MED ORDER — INSULIN DETEMIR 100 UNIT/ML ~~LOC~~ SOLN
6.0000 [IU] | Freq: Every day | SUBCUTANEOUS | Status: DC
  Administered 2019-07-21: 6 [IU] via SUBCUTANEOUS
  Filled 2019-07-21 (×2): qty 0.06

## 2019-07-21 MED ORDER — SODIUM PHOSPHATES 45 MMOLE/15ML IV SOLN: 30.0000 mmol | Freq: Once | INTRAVENOUS | Status: DC

## 2019-07-21 NOTE — Progress Notes (Addendum)
PROGRESS NOTE    Crystal Jacobs  ZOX:[id number] DOB: [birth year] DOA: 07/13/2019 PCP: Crystal Jacobs, No Pcp Per     Brief Narrative:  59 y.o. female from [place] PMHx DM type II uncontrolled with complication, HTN, CKD stage V, s/p Nephrectomy who presented to [place] ED for evaluation of generalized weakness.  History is supplemented by daughter at bedside.  About 2 weeks ago Crystal Jacobs began to feel generally weak.  She had been having constipation and about 1 week ago she took Dulcolax.  The following day she had 9 bowel movements which have reportedly normalized since then.  She has however been feeling very weak and unable to perform the usual amount of activity she is used to.  She says she has had poor appetite and low oral intake.  She has had some nausea and vomiting.  She says she has a history of CKD stage V weight loss reported creatinine 7.7 in April 2021.  She says she is makes a small amount of urine without dysuria.  She says she usually gets injections to keep her blood counts up.  She denies any obvious bleeding.  She says she now again has decreased bowel movements than expected.  She is visiting from [place] and arrived 1 month ago.  She has been on a low-sodium diet.  She has a medication list which reads as follows:  Lantus 8 units once a day Novorapid 6 units daily Pantoprazole Multivitamin Minipress XL 5 mg twice daily Sodium bicarb 500 twice daily Febuxostat40 mg daily for gout Clopitab (Plavix/ASA 75/10) daily Cilnidipine 10/50 9 am and 9 pm   Subjective: 6/27 afebrile overnight, A/O x4.  Myself and Dr. Hollie Salk nephrology met with Crystal Jacobs and son.  Crystal Jacobs stated her goal was to obtain renal transplant.  Crystal Jacobs was counseled on the amount of time it takes to obtain a renal transplant and that she would currently need to have some form of HD to hold her over until transplant approved.  Crystal Jacobs and son agreed to continue HD.   Assessment & Plan: Covid  vaccination; +1/2 vaccination    Principal Problem:   Hyponatremia Active Problems:   CKD (chronic kidney disease), stage V (HCC)   Microcytic anemia   Thrombocytopenia (HCC)   Insulin dependent type 2 diabetes mellitus (HCC)   Hypochloremia  Acute altered mental status/Acute Metabolic Encephalopathy -Per daughter even though sodium has mostly corrected Crystal Jacobs's mental status has worsened in the past 24 to 48 hours.  States mother is talking gibberish unable to answer question coherently.  This a.m. combative unable to coherently answer questions. -CT head negative for acute findings see results below -Ammonia WNL -Pan culture negative to date -ABG does not account for Crystal Jacobs's altered mental status. -6/24 Crystal Jacobs has obtain head CT decrease Haldol 1 mg BID. PRN -6/24 NO MEDICATION FOR SLEEPING.  PRIMARY TEAM only to change medication. -6/27 resolved.  Some tongue fasciculations and asterixis which is secondary to Crystal Jacobs being a brand-new HD Crystal Jacobs.  Crystal Jacobs and son counseled that not therapeutic yet on HD machine.  Severe hypovolemic Hyponatremia/Hypochloremia: -Hold all Crystal Jacobs's sodium has improved mental status has not in fact has worsened.   Recent Labs  Lab 07/17/19 2232 07/18/19 0435 07/19/19 0401 07/20/19 0408 07/21/19 0441  NA 130* 128* 135 134* 135  -Per daughter who has been at her bedside Crystal Jacobs's mental status has continued to deteriorate in the last 48 to 24 hours. -6/24 per daughter Crystal Jacobs is improving cognition wise but not quite back  to baseline.  I agree after exam that her cognition has improved today. -6/25 resolved  Essential HTN -6/23 Amlodipine 10 mg daily -6/24 normalize BP.  CT shows negative stroke  -6/27 metoprolol 12.5 mg BID  CKD (chronic kidney disease), stage V (HCC)-->> ESRD on HD per nephrology -Crystal Jacobs has a fistula in the left arm with a good thrill, the daughter relates that her creatinine usually ranges around 7. -6/24 first day of  HD Recent Labs  Lab 07/17/19 2232 07/18/19 0435 07/19/19 0401 07/20/19 0408 07/21/19 0441  CREATININE 6.06* 6.02* 3.71* 2.56* 1.73*  -Per Dr. Hollie Salk Crystal Jacobs's remaining kidney only working at 2 to 3%.  Crystal Jacobs considered ESRD  Chronic thrombocytopenia: -At home she takes aspirin and Plavix. -Thrombocytopenia continues to slowly worsen.  No signs or symptoms of overt bleeding.  Monitor closely. .-Most likely reactive.   -Continue to hold all heparin products  -6/23 HIT panel negative  Microcytic anemia anemia of chronic renal disease: She states she has been receiving erythropoietin as an outpatient. Anemia panel is pending, her current hemoglobin is 12.5. Recent Labs  Lab 07/17/19 0516 07/18/19 0435 07/19/19 0401 07/20/19 0408 07/21/19 0441  HGB 10.9* 8.8* 8.7* 8.0* 8.4*  -Hemoccult pending  DM type II controlled with complication -1/09 hemoglobin A1c = 6.2  -6/27 increase Levemir 6 units daily  -NovoLog 2 units qac  Agitation/Anxiety -6/24 Haldol 2 mg BID PRN  Metabolic acidosis? -Resolved  Hypokalemia/Hyperkalemia: -Stable  Hypomagnesmia -Stable  Hypophosphatemia -Sodium phosphate 20 mmol  Underweight: Continue her renal diet.  Bacteriuria -6/24 start empiric antibiotics, urine is dirty.  Urine culture pending  Chronic hepatitis B -Hepatitis B surface antigen/core antibody positive -6/26 will obtain hepatitis B surface antibody -6/26 obtain hepatitis B viral load 14,200 -6/27 consult ID Crystal Jacobs is chronic hepatitis B carrier    Goals of care -6/26 son and to some extent daughter cannot understand what has occurred, and have unrealistic expectations. -Each day the son asks the exact same questions in a slightly different manner.  -Son cannot understand that there is no single reason for Crystal Jacobs's ONLY remaining kidney to be failing and therefore Crystal Jacobs requires hemodialysis to survive.  This has been explained to Crystal Jacobs in detail by myself  and Dr. Hollie Salk nephrologist.  -Son unable to understand that since there is no SINGLE cause of his mother's cognitive decline, there is no simple treatment plan that will immediately resolve all of her issues.  -Son revealed to me today 6/26 that family had been intentionally withholding sodium, calcium, magnesium, potassium from Crystal Jacobs's diet.  Son unable to understand that these are ABSOLUTELY CRITICAL elements for the body to function correctly.  -Sons unrealistic expectations have led him to believe that Crystal Jacobs can obtain kidney transplant in a short period of time, that hemodialysis with only 2 or 3 days of treatment will return Crystal Jacobs to baseline even though family admits Crystal Jacobs has been deteriorating over a couple of weeks minimal.  -Crystal Jacobs's cognition has significantly improved with minimal hemodialysis however Crystal Jacobs needs to continue.  In order to obtain maximum benefit.  Son and daughter have decided to terminate any future HD.  -Family unable to understand that a medical treatment plan is dynamic, and believe that any plan that may have been discussed should be written in stone..  -Dr. Hollie Salk nephrology and myself have spent extensive amount of time trying to explain the necessity of the treatment plan.  Had to inform son today that it was impossible to explain the whole pathophysiology of  the human body and why it needed to have all of these elements to run and that the kidney was extremely important for management of these elements and for filtration of toxins.  Counseled family they needed to trust that if Dr. Hollie Salk and myself agreed on a treatment plan and that it was in the best interest of the Crystal Jacobs, however it was in their rights to refuse treatment..  -Son and daughter have decided that today's HD treatment will be Crystal Jacobs's last against the advice of myself and Dr. Hollie Salk.  We will speak with family in the A.m.  -Will consult palliative care for goals of care; home hospice vs  residential hospice.       DVT prophylaxis: SCD Code Status: Full Family Communication: 6/24 spoke with daughter and son discussed at length with them that there is usually not just one because for problem such as her mother is having.  Son is very keen on pinning now just 1 reason for his mother being ill and having cognitive changes.  Again discussed at length reason for requiring hemodialysis.  Brother and daughter inquired into if they wanted to stop hemodialysis what other risk factors and I informed them there was a risk of DEATH.  Brother and sister decided that today would be the last day of HD.  I informed Dr. Hollie Salk nephrology of this family decision Status is: Inpatient    Dispo: The Crystal Jacobs is from: Home              Anticipated d/c is to: Home              Anticipated d/c date is: 6/30              Crystal Jacobs currently unstable      Consultants:  Nephrology Dr. Hollie Salk   Procedures/Significant Events:  6/24 CT head W0 contrast; chronic small vessel disease without acute intracranial abnormality    I have personally reviewed and interpreted all radiology studies and my findings are as above.  VENTILATOR SETTINGS:    Cultures 6/19 SARS coronavirus negative 6/20 MRSA by PCR negative 6/20 HIV nonreactive 6/23 blood RIGHT AC NGTD 6/23 blood LEFT wrist NGTD 6/24 urine insignificant growth 6/24 hepatitis B surface antigen positive 6/24 hepatitis B core total antibody positive 6/26 hepatitis B E antigen negative 6/26 hepatitis B post<3.1 6/26 hepatitis B virus DNA 14,200 6/26 hepatitis B core total antibody positive    Antimicrobials: Anti-infectives (From admission, onward)   Start     Ordered Stop   07/18/19 1000  cefTRIAXone (ROCEPHIN) 1 g in sodium chloride 0.9 % 100 mL IVPB  Status:  Discontinued        07/18/19 0905 07/20/19 1230       Devices    LINES / TUBES:      Continuous Infusions: . sodium chloride 250 mL (07/19/19 1721)  . sodium  chloride    . sodium chloride    . sodium phosphate  Dextrose 5% IVPB       Objective: Vitals:   07/20/19 2253 07/21/19 0434 07/21/19 0438 07/21/19 0838  BP: (!) 138/49 (!) 139/57  (!) 157/60  Pulse: (!) 109 83  91  Resp: _0 Temp:  97.9 F (36.6 C)  98 F (36.7 C)  TempSrc:  Oral  Oral  SpO2: 100% 99%  100%  Weight:   47.5 kg   Height:        Intake/Output Summary (Last 24 hours) at 07/21/2019 0857  Last data filed at 07/21/2019 0041 Gross per 24 hour  Intake 120 ml  Output 51 ml  Net 69 ml   Filed Weights   07/20/19 1436 07/20/19 1824 07/21/19 0438  Weight: 46.7 kg 46.7 kg 47.5 kg    Physical Exam:  General: A/O x4, no acute respiratory distress Eyes: negative scleral hemorrhage, negative anisocoria, negative icterus ENT: Negative Runny nose, negative gingival bleeding, Neck:  Negative scars, masses, torticollis, lymphadenopathy, JVD Lungs: Clear to auscultation bilaterally without wheezes or crackles Cardiovascular: Regular rate and rhythm without murmur gallop or rub normal S1 and S2 Abdomen: negative abdominal pain, nondistended, positive soft, bowel sounds, no rebound, no ascites, no appreciable mass Extremities: No significant cyanosis, clubbing, or edema bilateral lower extremities Skin: Negative rashes, lesions, ulcers Psychiatric:  Negative depression, negative anxiety, negative fatigue, negative mania  Central nervous system:  Cranial nerves II through XII intact, tongue/uvula midline, all extremities muscle strength 5/5, sensation intact throughout,  negative dysarthria, negative expressive aphasia, negative receptive aphasia.  .     Data Reviewed: Care during the described time interval was provided by me .  I have reviewed this Crystal Jacobs's available data, including medical history, events of note, physical examination, and all test results as part of my evaluation.  CBC: Recent Labs  Lab 07/17/19 0516 07/18/19 0435 07/19/19 0401 07/20/19 0408  07/21/19 0441  WBC 8.3 8.2 7.3 6.3 6.4  NEUTROABS  --  6.9 5.5 4.8 4.8  HGB 10.9* 8.8* 8.7* 8.0* 8.4*  HCT 34.8* 27.8* 27.6* 25.5* 26.6*  MCV 62.6* 62.2* 62.3* 63.0* 63.9*  PLT 82* 83* 83* 73* 77*   Basic Metabolic Panel: Recent Labs  Lab 07/17/19 2232 07/18/19 0435 07/19/19 0401 07/20/19 0408 07/20/19 0938 07/21/19 0441  NA 130* 128* 135 134*  --  135  K 5.0 4.6 3.5 3.1*  --  3.9  CL 97* 98 99 99  --  99  CO2 22 21* 25 26  --  30  GLUCOSE 206* 143* 77 98  --  135*  BUN 37* 37* 18 9  --  5*  CREATININE 6.06* 6.02* 3.71* 2.56*  --  1.73*  CALCIUM 9.6 8.9 8.8* 8.3*  --  8.7*  MG  --  2.2 2.0 1.8 1.7 2.3  PHOS 3.8 3.9 3.5 2.4*  --  1.7*   GFR: Estimated Creatinine Clearance: 27.2 mL/min (A) (by C-G formula based on SCr of 1.73 mg/dL (H)). Liver Function Tests: Recent Labs  Lab 07/17/19 1352 07/17/19 1731 07/17/19 2232 07/18/19 0435 07/19/19 0401 07/20/19 0408 07/21/19 0441  AST 40  --   --  40 _0 ALT 22  --   --  _1 ALKPHOS 47  --   --  45 45 47 43  BILITOT 1.0  --   --  0.8 0.7 0.7 0.8  PROT 5.2*  --   --  5.1* 5.1* 4.8* 5.0*  ALBUMIN 2.8*   < > 3.1* 2.7* 2.7* 2.4* 2.5*   < > = values in this interval not displayed.   No results for input(s): LIPASE, AMYLASE in the last 168 hours. Recent Labs  Lab 07/17/19 1731  AMMONIA 22   Coagulation Profile: No results for input(s): INR, PROTIME in the last 168 hours. Cardiac Enzymes: No results for input(s): CKTOTAL, CKMB, CKMBINDEX, TROPONINI in the last 168 hours. BNP (last 3 results) No results for input(s): PROBNP in the last 8760 hours. HbA1C: No results for input(s): HGBA1C in the  last 72 hours. CBG: Recent Labs  Lab 07/20/19 0751 07/20/19 1108 07/20/19 1935 07/20/19 2242 07/21/19 0638  GLUCAP 94 107* 96 252* 101*   Lipid Profile: No results for input(s): CHOL, HDL, LDLCALC, TRIG, CHOLHDL, LDLDIRECT in the last 72 hours. Thyroid Function Tests: No results for input(s): TSH, T4TOTAL,  FREET4, T3FREE, THYROIDAB in the last 72 hours. Anemia Panel: Recent Labs    07/18/19 1002  FERRITIN 1,582*  TIBC 157*  IRON 142   Sepsis Labs: No results for input(s): PROCALCITON, LATICACIDVEN in the last 168 hours.  Recent Results (from the past 240 hour(s))  SARS Coronavirus 2 by RT PCR (hospital order, performed in [facility] hospital lab) Nasopharyngeal Nasopharyngeal Swab     Status: None   Collection Time: 07/13/19 10:30 PM   Specimen: Nasopharyngeal Swab  Result Value Ref Range Status   SARS Coronavirus 2 NEGATIVE NEGATIVE Final    Comment: (NOTE) SARS-CoV-2 target nucleic acids are NOT DETECTED.  The SARS-CoV-2 RNA is generally detectable in upper and lower respiratory specimens during the acute phase of infection. The lowest concentration of SARS-CoV-2 viral copies this assay can detect is 250 copies / mL. A negative result does not preclude SARS-CoV-2 infection and should not be used as the sole basis for treatment or other Crystal Jacobs management decisions.  A negative result may occur with improper specimen collection / handling, submission of specimen other than nasopharyngeal swab, presence of viral mutation(s) within the areas targeted by this assay, and inadequate number of viral copies (<250 copies / mL). A negative result must be combined with clinical observations, Crystal Jacobs history, and epidemiological information.  Fact Sheet for Patients:   StrictlyIdeas.no  Fact Sheet for Healthcare Providers: BankingDealers.co.za  This test is not yet approved or  cleared by the [place] FDA and has been authorized for detection and/or diagnosis of SARS-CoV-2 by FDA under an Emergency Use Authorization (EUA).  This EUA will remain in effect (meaning this test can be used) for the duration of the COVID-19 declaration under Section 564(b)(1) of the Act, 21 U.S.C. section 360bbb-3(b)(1), unless the authorization is  terminated or revoked sooner.  Performed at [facility], [address] [place], [place] [address]   MRSA PCR Screening     Status: None   Collection Time: 07/14/19 12:27 AM  Result Value Ref Range Status   MRSA by PCR NEGATIVE NEGATIVE Final    Comment:        The GeneXpert MRSA Assay (FDA approved for NASAL specimens only), is one component of a comprehensive MRSA colonization surveillance program. It is not intended to diagnose MRSA infection nor to guide or monitor treatment for MRSA infections. Performed at [facility], [place] [address], [place], [place] [address]   Culture, blood (routine x 2)     Status: None (Preliminary result)   Collection Time: 07/17/19  5:31 PM   Specimen: BLOOD  Result Value Ref Range Status   Specimen Description BLOOD RIGHT ANTECUBITAL  Final   Special Requests   Final    BOTTLES DRAWN AEROBIC ONLY Blood Culture adequate volume   Culture   Final    NO GROWTH 4 DAYS Performed at [facility], [place] [address], [place], [place] [address]    Report Status PENDING  Incomplete  Culture, blood (routine x 2)     Status: None (Preliminary result)   Collection Time: 07/17/19  5:38 PM   Specimen: BLOOD LEFT WRIST  Result Value Ref Range  Status   Specimen Description BLOOD LEFT WRIST  Final   Special Requests   Final    BOTTLES DRAWN AEROBIC ONLY Blood Culture adequate volume   Culture   Final    NO GROWTH 4 DAYS Performed at [facility], [address] [place], [place] [address]    Report Status PENDING  Incomplete  Culture, Urine     Status: Abnormal   Collection Time: 07/18/19  6:20 PM   Specimen: Urine, Random  Result Value Ref Range Status   Specimen Description URINE, RANDOM  Final   Special Requests NONE  Final   Culture (A)  Final    <10,000 COLONIES/mL INSIGNIFICANT GROWTH Performed at [facility], [place] [address], [place], [place] [address]    Report Status 07/20/2019 FINAL  Final          Radiology Studies: No results found.      Scheduled Meds: . amLODipine  10 mg Oral Daily  . aspirin EC  81 mg Oral Daily  . Chlorhexidine Gluconate Cloth  6 each Topical Q0600  . cholecalciferol  1,000 Units Oral Q1400  . clopidogrel  75 mg Oral Daily  . darbepoetin (ARANESP) injection - NON-DIALYSIS  60 mcg Subcutaneous Q Thu-1800  . febuxostat  40 mg Oral Q1400  . feeding supplement (NEPRO CARB STEADY)  237 mL Oral BID BM  . insulin aspart  0-6 Units Subcutaneous TID WC  . insulin aspart  2 Units Subcutaneous TID WC  . insulin detemir  2 Units Subcutaneous QHS  . multivitamin  1 tablet Oral QHS  . sodium chloride flush  3 mL Intravenous Q12H  . sodium chloride flush  3 mL Intravenous Q12H   Continuous Infusions: . sodium chloride 250 mL (07/19/19 1721)  . sodium chloride    . sodium chloride    . sodium phosphate  Dextrose 5% IVPB       LOS: 7 days    Time spent:40 min    Lucresia Simic, Geraldo Docker, MD [facility] Pager [phone]  If 7PM-7AM, please contact night-coverage [email or web address] Password [facility] 07/21/2019, 8:57 AM

## 2019-07-21 NOTE — Progress Notes (Signed)
[facility] Progress Note    Assessment/ Plan:   1. Hyponatremia - Na+ 106 on admit 6/19 w/ orthostatic symptoms and recent N/V, diarrhea.  Hypovolemic. Na + improving w/ normal saline up to 117 6/20, s/p D5W at 50/hr x 4 hrs and stabilized, then NS resumed at 75/ hr.  Na getting better, 123--> 126--> 129--> 130-->128.  Now normalized with HD, stop IVFs  2. 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  Creat 6.5 > 5.5-- 6.2 here w/ IVF's.  Discussions with pt and son.  Now that we are correcting Na, pt still has poor appetite and + asterixis.HD #1 6/24, HD #2 6/25 HD #3 6/26.  Improvement in symptoms with HD..  Multiple long discussions with pt, son, dtr re: ESRD.  Dialysis is life- sustaining at this point and stopping or even skipping rx confers greater risk of MI, CVA, uremic pericarditis, sleepiness, lethargy, buildup of minerals and uremic toxins, uremic coma, and death.  Have agreed to continue HD for now.  Will write orders for tomorrow 6/28.  3. Acute encephalopathy: Improving. CT head negative, ABG OK, ammonia OK, AXR without constipation, blood cultures neg.  UA on admission with bacteruria, empirically rx with Rocephin and reculture--> in process.  Likely uremia.  4. Twitching: multifactorial but likely mineral imbalances.  Correcting K, Ca, Mg, giving Na Phos today as phos 1.7.  Ceftiaxone stopped.  5. Hep B +: S Ag positive, SAB, CAB, Hep B e, HBV PCR in process  6. Hypovolemia -resolved  7. HyperK- resolved  8. IDDM - per primary team  9.  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 Dispo: pt and son/ dtr want to do HHD.  Have already contacted [facility] RN manager to see if this is possible.  I think a lot will hinge on insurance coverage.  Subjective:    Dr Sherral Hammers and I met together with the son and the pt in the room with the pt by herself at first and then with the son.  Multiple questions answered re: if we can stop dialysis or  not.  Discussion that she is ESRD and that stopping confers an increased risk of heart attacks, strokes, and other uremic complications including death as discussed previously.  Lots of questions re: insurance coverage for which I have already consulted SW.  Ultimately pt and son agreed to continue dialysis.     Objective:   BP (!) 157/60 (BP Location: Right Arm)   Pulse 91   Temp 98 F (36.7 C) (Oral)   Resp 15   Ht '5\' 2"'  (1.575 m)   Wt 47.5 kg   LMP  (LMP Unknown)   SpO2 100%   BMI 19.15 kg/m   Intake/Output Summary (Last 24 hours) at 07/21/2019 1150 Last data filed at 07/21/2019 0041 Gross per 24 hour  Intake 120 ml  Output 51 ml  Net 69 ml   Weight change: 1 kg  Physical Exam: Gen: NAD, lying in bed, NAD CVS: RRR Resp: clear  Abd: soft Ext: no LE edema ACCESS: LUE AVF + T/B NEURO: AAOx 3, some mild twitching  Imaging: No results found.  Labs: BMET Recent Labs  Lab 07/17/19 1352 07/17/19 1731 07/17/19 2232 07/18/19 0435 07/19/19 0401 07/20/19 0408 07/21/19 0441  NA 129* 128* 130* 128* 135 134* 135  K 5.5* 4.5 5.0 4.6 3.5 3.1* 3.9  CL 100 97* 97* 98 99 99 99  CO2 17* 20* 22 21* 25  26 30  GLUCOSE 152* 173* 206* 143* 77 98 135*  BUN 39* 34* 37* 37* 18 9 5*  CREATININE 5.93* 5.87* 6.06* 6.02* 3.71* 2.56* 1.73*  CALCIUM 8.8* 9.2 9.6 8.9 8.8* 8.3* 8.7*  PHOS 3.5 3.7 3.8 3.9 3.5 2.4* 1.7*   CBC Recent Labs  Lab 07/18/19 0435 07/19/19 0401 07/20/19 0408 07/21/19 0441  WBC 8.2 7.3 6.3 6.4  NEUTROABS 6.9 5.5 4.8 4.8  HGB 8.8* 8.7* 8.0* 8.4*  HCT 27.8* 27.6* 25.5* 26.6*  MCV 62.2* 62.3* 63.0* 63.9*  PLT 83* 83* 73* 77*    Medications:    . amLODipine  10 mg Oral Daily  . aspirin EC  81 mg Oral Daily  . Chlorhexidine Gluconate Cloth  6 each Topical Q0600  . cholecalciferol  1,000 Units Oral Q1400  . clopidogrel  75 mg Oral Daily  . darbepoetin (ARANESP) injection - NON-DIALYSIS  60 mcg Subcutaneous Q Thu-1800  . febuxostat  40 mg Oral Q1400  .  feeding supplement (NEPRO CARB STEADY)  237 mL Oral BID BM  . insulin aspart  0-6 Units Subcutaneous TID WC  . insulin aspart  2 Units Subcutaneous TID WC  . insulin detemir  2 Units Subcutaneous QHS  . multivitamin  1 tablet Oral QHS  . sodium chloride flush  3 mL Intravenous Q12H  . sodium chloride flush  3 mL Intravenous Q12H      Madelon Lips, MD 07/21/2019, 11:50 AM

## 2019-07-21 NOTE — Progress Notes (Signed)
MD, patient's son has several questions regarding her ability to swallow.  He stated that she is having some tongue twitches that started during the day on 07/20/2019.    He also stated that she did have a good dinner last night of rice which raised her glucose to 252.  She swallowed well enough to eat.    Her son stated that no one is really giving him any solid answers when the doctors round and he is hoping that the doctors will take the time to let him know what is happening.

## 2019-07-21 NOTE — Social Work (Signed)
CSW spoke to pts son about home dialysis. CSW informed pt that the renal SW works Monday - Friday.m CSW will reach out t Jaclyn Shaggy to find out about home dialysis and insurance coverage.   Pts son also inquired about what the pts insurance covers as far as the hospital stay. CSW informed pt he will have to reach out to [facility] [employer] upon discharge.   Emeterio Reeve, Latanya Presser, [place] Social Worker [phone]

## 2019-07-21 NOTE — Progress Notes (Deleted)
Patient's sister mentioned that patient has had his upper partials in for over one year and that they are unable to remove them.  She stated that they will not and has not come out.  She wants MD to be aware sine patient is scheduled for surgery at [place] on 07/21/2019.

## 2019-07-22 ENCOUNTER — Encounter (HOSPITAL_COMMUNITY): Payer: Self-pay | Admitting: Internal Medicine

## 2019-07-22 DIAGNOSIS — I10 Essential (primary) hypertension: Secondary | ICD-10-CM | POA: Diagnosis present

## 2019-07-22 DIAGNOSIS — B181 Chronic viral hepatitis B without delta-agent: Secondary | ICD-10-CM | POA: Diagnosis present

## 2019-07-22 LAB — COMPREHENSIVE METABOLIC PANEL
ALT: 25 U/L (ref 0–44)
AST: 30 U/L (ref 15–41)
Albumin: 2.8 g/dL — ABNORMAL LOW (ref 3.5–5.0)
Alkaline Phosphatase: 60 U/L (ref 38–126)
Anion gap: 7 (ref 5–15)
BUN: 15 mg/dL (ref 6–20)
CO2: 28 mmol/L (ref 22–32)
Calcium: 8.9 mg/dL (ref 8.9–10.3)
Chloride: 98 mmol/L (ref 98–111)
Creatinine, Ser: 2.68 mg/dL — ABNORMAL HIGH (ref 0.44–1.00)
GFR calc Af Amer: 22 mL/min — ABNORMAL LOW (ref >60)
GFR calc non Af Amer: 19 mL/min — ABNORMAL LOW (ref >60)
Glucose, Bld: 127 mg/dL — ABNORMAL HIGH (ref 70–99)
Potassium: 3.8 mmol/L (ref 3.5–5.1)
Sodium: 133 mmol/L — ABNORMAL LOW (ref 135–145)
Total Bilirubin: 1.1 mg/dL (ref 0.3–1.2)
Total Protein: 5.4 g/dL — ABNORMAL LOW (ref 6.5–8.1)

## 2019-07-22 LAB — CBC WITH DIFFERENTIAL/PLATELET
Abs Immature Granulocytes: 0.07 10*3/uL (ref 0.00–0.07)
Basophils Absolute: 0.1 10*3/uL (ref 0.0–0.1)
Basophils Relative: 1 %
Eosinophils Absolute: 0 10*3/uL (ref 0.0–0.5)
Eosinophils Relative: 0 %
HCT: 28.9 % — ABNORMAL LOW (ref 36.0–46.0)
Hemoglobin: 9 g/dL — ABNORMAL LOW (ref 12.0–15.0)
Immature Granulocytes: 1 %
Lymphocytes Relative: 8 %
Lymphs Abs: 0.7 10*3/uL (ref 0.7–4.0)
MCH: 20 pg — ABNORMAL LOW (ref 26.0–34.0)
MCHC: 31.1 g/dL (ref 30.0–36.0)
MCV: 64.1 fL — ABNORMAL LOW (ref 80.0–100.0)
Monocytes Absolute: 0.7 10*3/uL (ref 0.1–1.0)
Monocytes Relative: 8 %
Neutro Abs: 7.1 10*3/uL (ref 1.7–7.7)
Neutrophils Relative %: 82 %
Platelets: 101 10*3/uL — ABNORMAL LOW (ref 150–400)
RBC: 4.51 MIL/uL (ref 3.87–5.11)
RDW: 18 % — ABNORMAL HIGH (ref 11.5–15.5)
WBC: 8.6 10*3/uL (ref 4.0–10.5)
nRBC: 0.7 % — ABNORMAL HIGH (ref 0.0–0.2)

## 2019-07-22 LAB — GLUCOSE, CAPILLARY
Glucose-Capillary: 67 mg/dL — ABNORMAL LOW (ref 70–99)
Glucose-Capillary: 83 mg/dL (ref 70–99)
Glucose-Capillary: 171 mg/dL — ABNORMAL HIGH (ref 70–99)
Glucose-Capillary: 162 mg/dL — ABNORMAL HIGH (ref 70–99)
Glucose-Capillary: 117 mg/dL — ABNORMAL HIGH (ref 70–99)

## 2019-07-22 LAB — CULTURE, BLOOD (ROUTINE X 2)
Culture: NO GROWTH
Culture: NO GROWTH
Special Requests: ADEQUATE
Special Requests: ADEQUATE

## 2019-07-22 LAB — MAGNESIUM: Magnesium: 2.4 mg/dL (ref 1.7–2.4)

## 2019-07-22 LAB — PHOSPHORUS: Phosphorus: 3.8 mg/dL (ref 2.5–4.6)

## 2019-07-22 MED ORDER — INSULIN DETEMIR 100 UNIT/ML ~~LOC~~ SOLN
3.0000 [IU] | Freq: Two times a day (BID) | SUBCUTANEOUS | Status: DC
  Administered 2019-07-23 (×2): 3 [IU] via SUBCUTANEOUS
  Filled 2019-07-22 (×5): qty 0.03

## 2019-07-22 MED ORDER — CHLORHEXIDINE GLUCONATE CLOTH 2 % EX PADS: 6.0000 | MEDICATED_PAD | Freq: Every day | CUTANEOUS | Status: DC

## 2019-07-22 NOTE — Progress Notes (Addendum)
Nutrition Education Note  RD consulted for Renal Education.   RD was consulted earlier this admission for assessment and education, however, RD unable to speak with pt or family secondary to being at HD or in with other providers at time of visit. See note dated 07/18/19 for details.   RD attempted to meet with family in room, but daughter had just stepped out of room on her cell phone. RD also attempted to speak with pt/family via room phone, however, no answer.   Attached "Food Pyramid for Healthy Eating with Kidney Disease" handout to AVS/ discharge summary.   Per nephrology notes, plan for HD today. Will attempt to stop by on a non-HD to provide education so pt and family can better participate.   Loistine Chance, RD, LDN, [place] Registered Dietitian II Certified Diabetes Care and Education Specialist Please refer to [facility] for RD and/or RD on-call/weekend/after hours pager

## 2019-07-22 NOTE — Progress Notes (Signed)
Renal Navigator met with patient, daughter and son at bedside to discuss options for OP HD. They are understanding that HHD is not an option straight from the hospital due to timing of appointments (see previous note from today). Dr. Royce Macadamia has already informed them of this. Navigator asked patient how she is feeling today and she replied, "nice." Patient's daughter had "uncle" on speaker phone while we spoke for at least 30 minutes. Navigator noted that patient is scheduled for HD today (knowing that she has already refused) and the daughter replied, "yes, but we are still deciding." Navigator asked family to update Renal Navigator to this point and discuss how they are feeling about OP HD/decisions they are weighing.  Patient's daughter mentioned having her mother return to [place] earlier than planned, but could not give me any specifics on this. Patient's son politely suggested that he thinks his mother's electrolytes are now "balanced" and since all "this" happened from her episode of diarrhea, she should be okay to go home without further HD treatment. Navigator explained that as a Holiday representative, discussing patient's medical situation is not something Navigator can do, but explained that Navigator is there to speak with them regarding options for OP HD as it is the recommendation of the medical team that his mother continue OP HD. Navigator added that patient would be risking her life if she chooses to discontinue HD at this time.  Navigator asked them if anyone has called the patient's [employer] policy to inquire if it covers HD, as Navigator is concerned it will not. Patient's daughter states she has called and it WILL NOT cover HD. Patient's son asked if it will cover HHD if he were to take her home with him to [place]. Navigator expects that it will not cover any form of HD, but encouraged him to call and ask. Navigator reports no further inquiry into HHD since it is not an  option straight from the hospital. Patient's son asked how much a HHD machine would cost out of pocket. Navigator again does not know the answer to this, but stated that it is imaginably an astronomical fee to buy out of pocket. Again, HHD is not an option because Navigator cannot arrange training until at least a month from now.  Patient's family is interested in getting quotes on self pay for OP HD, which Navigator will look in to. Navigator will contact [facility] to see if self pay is an option for the family.  Navigator asked how they are feeling after our discussion today. Patient's son replied that once they have more information of how much self pay will cost, they should have an answer on whether patient will return home to [place] now or if they will pay out of pocket for HD here. Navigator has made contact with both [facility] and is awaiting self pay quotes. Navigator will follow closely and follow up with family and medical team once information is available.  Alphonzo Cruise, [place] Renal Navigator [phone]

## 2019-07-22 NOTE — Progress Notes (Signed)
PROGRESS NOTE    Crystal Jacobs  SFK:[id number] DOB: 1961-01-12 DOA: 07/13/2019 PCP: Patient, No Pcp Per     Brief Narrative:  59 y.o. female from [place] PMHx DM type II uncontrolled with complication, HTN, CKD stage V, s/p Nephrectomy who presented to [place] ED for evaluation of generalized weakness.  History is supplemented by daughter at bedside.  About 2 weeks ago patient began to feel generally weak.  She had been having constipation and about 1 week ago she took Dulcolax.  The following day she had 9 bowel movements which have reportedly normalized since then.  She has however been feeling very weak and unable to perform the usual amount of activity she is used to.  She says she has had poor appetite and low oral intake.  She has had some nausea and vomiting.  She says she has a history of CKD stage V weight loss reported creatinine 7.7 in April 2021.  She says she is makes a small amount of urine without dysuria.  She says she usually gets injections to keep her blood counts up.  She denies any obvious bleeding.  She says she now again has decreased bowel movements than expected.  She is visiting from [place] and arrived 1 month ago.  She has been on a low-sodium diet.  She has a medication list which reads as follows:  Lantus 8 units once a day Novorapid 6 units daily Pantoprazole Multivitamin Minipress XL 5 mg twice daily Sodium bicarb 500 twice daily Febuxostat40 mg daily for gout Clopitab (Plavix/ASA 75/10) daily Cilnidipine 10/50 9 am and 9 pm   Subjective: 6/28 patient is alert but less interactive than on 6/27.  Follows commands.    Assessment & Plan: Covid vaccination; +1/2 vaccination    Active Problems:   Hyponatremia   CKD (chronic kidney disease), stage V (HCC)   Microcytic anemia   Thrombocytopenia (HCC)   Insulin dependent type 2 diabetes mellitus (HCC)   Hypochloremia   Hypertension   Chronic hepatitis B (HCC)  Acute altered mental  status/Acute Metabolic Encephalopathy -Per daughter even though sodium has mostly corrected patient's mental status has worsened in the past 24 to 48 hours.  States mother is talking gibberish unable to answer question coherently.  This a.m. combative unable to coherently answer questions. -CT head negative for acute findings see results below -Ammonia WNL -Pan culture negative to date -ABG does not account for patient's altered mental status. -6/24 patient has obtain head CT decrease Haldol 1 mg BID. PRN -6/24 NO MEDICATION FOR SLEEPING.  PRIMARY TEAM only to change medication. -6/27 resolved.  Some tongue fasciculations and asterixis which is secondary to patient being a brand-new HD patient.  Patient and son counseled that not therapeutic yet on HD machine. -6/28 spoke with daughter recommended that we continue HD, until LCSW Jaclyn Shaggy can obtain quotes on the cost of out-of-pocket HD in town.  Hopefully by tomorrow -Patient will require HD tomorrow.  Severe hypovolemic Hyponatremia/Hypochloremia: -Hold all patient's sodium has improved mental status has not in fact has worsened.   Recent Labs  Lab 07/18/19 0435 07/19/19 0401 07/20/19 0408 07/21/19 0441 07/22/19 0928  NA 128* 135 134* 135 133*  -Per daughter who has been at her bedside patient's mental status has continued to deteriorate in the last 48 to 24 hours. -6/24 per daughter patient is improving cognition wise but not quite back to baseline.  I agree after exam that her cognition has improved today. -6/25 resolved  Essential HTN -6/23 Amlodipine 10 mg daily -6/24 normalize BP.  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 -6/24 first day of HD Recent Labs  Lab 07/18/19 0435 07/19/19 0401 07/20/19 0408  07/21/19 0441 07/22/19 0928  CREATININE 6.02* 3.71* 2.56* 1.73* 2.68*  -Per Dr. Hollie Salk patient's remaining kidney only working at 2 to 3%.  Patient considered ESRD  Chronic thrombocytopenia: -At home she takes aspirin and Plavix. -Thrombocytopenia continues to slowly worsen.  No signs or symptoms of overt bleeding.  Monitor closely. .-Most likely reactive.   -Continue to hold all heparin products  -6/23 HIT panel negative  Microcytic anemia anemia of chronic renal disease: She states she has been receiving erythropoietin as an outpatient. Anemia panel is pending, her current hemoglobin is 12.5. Recent Labs  Lab 07/18/19 0435 07/19/19 0401 07/20/19 0408 07/21/19 0441 07/22/19 0928  HGB 8.8* 8.7* 8.0* 8.4* 9.0*  -Hemoccult pending  DM type II controlled with complication -9/38 hemoglobin A1c = 6.2  -6/28 change Levemir 3 units BID  -NovoLog 2 units qac  Agitation/Anxiety -6/24 Haldol 2 mg BID PRN  Metabolic acidosis? -Resolved  Hypokalemia/Hyperkalemia: -Stable  Hypomagnesmia -Stable  Hypophosphatemia -Stable  Underweight: Continue her renal diet.  Bacteriuria -6/24 start empiric antibiotics, urine is dirty.  Urine culture pending  Chronic hepatitis B -Hepatitis B surface antigen/core antibody positive -6/26 will obtain hepatitis B surface antibody -6/26 obtain hepatitis B viral load 14,200 -6/28 consulted ID as it appears patient is a chronic hepatitis B carrier -6/28 Dr. Michel Bickers, ID discussed treating patient with son and daughter and they declined treatment, preferring that patient return to [place] for further evaluation of her hepatitis B.    Goals of care -6/26 son and to some extent daughter cannot understand what has occurred, and have unrealistic expectations. -Each day the son asks the exact same questions in a slightly different manner.  -Son cannot understand that there is no single reason for patient's ONLY remaining kidney to be  failing and therefore patient requires hemodialysis to survive.  This has been explained to patient in detail by myself and Dr. Hollie Salk nephrologist.  -Son unable to understand that since there is no SINGLE cause of his mother's cognitive decline, there is no simple treatment plan that will immediately resolve all of her issues.  -Son revealed to me today 6/26 that family had been intentionally withholding sodium, calcium, magnesium, potassium from patient's diet.  Son unable to understand that these are ABSOLUTELY CRITICAL elements for the body to function correctly.  -Sons unrealistic expectations have led him to believe that patient can obtain kidney transplant in a short period of time, that hemodialysis with only 2 or 3 days of treatment will return patient to baseline even though family admits patient has been deteriorating over a couple of weeks minimal.  -Patient's cognition has significantly improved with minimal hemodialysis however patient needs to continue.  In order to obtain maximum benefit.  Son and daughter have decided to terminate any future HD.  -Family unable to understand that a medical treatment plan is dynamic, and believe that any plan that may have been discussed should be written in stone..  -Dr. Hollie Salk nephrology and myself have spent extensive amount of time trying to explain the necessity of the treatment plan.  Had  to inform son today that it was impossible to explain the whole pathophysiology of the human body and why it needed to have all of these elements to run and that the kidney was extremely important for management of these elements and for filtration of toxins.  Counseled family they needed to trust that if Dr. Hollie Salk and myself agreed on a treatment plan and that it was in the best interest of the patient, however it was in their rights to refuse treatment..  -Son and daughter have decided that today's HD treatment will be patient's last against the advice of myself and Dr.  Hollie Salk.  We will speak with family in the A.m.  -Will consult palliative care for goals of care; home hospice vs residential hospice.       DVT prophylaxis: SCD Code Status: Full Family Communication: 6/24 spoke with daughter and son discussed at length with them that there is usually not just one because for problem such as her mother is having.  Son is very keen on pinning now just 1 reason for his mother being ill and having cognitive changes.  Again discussed at length reason for requiring hemodialysis.  Brother and daughter inquired into if they wanted to stop hemodialysis what other risk factors and I informed them there was a risk of DEATH.  Brother and sister decided that today would be the last day of HD.  I informed Dr. Hollie Salk nephrology of this family decision Status is: Inpatient    Dispo: The patient is from: Home              Anticipated d/c is to: Home              Anticipated d/c date is: 6/30              Patient currently unstable      Consultants:  Nephrology Dr. Hollie Salk  ID Dr. Michel Bickers    Procedures/Significant Events:  6/24 CT head W0 contrast; chronic small vessel disease without acute intracranial abnormality    I have personally reviewed and interpreted all radiology studies and my findings are as above.  VENTILATOR SETTINGS:    Cultures 6/19 SARS coronavirus negative 6/20 MRSA by PCR negative 6/20 HIV nonreactive 6/23 blood RIGHT AC NGTD 6/23 blood LEFT wrist NGTD 6/24 urine insignificant growth 6/24 hepatitis B surface antigen positive 6/24 hepatitis B core total antibody positive 6/26 hepatitis B E antigen negative 6/26 hepatitis B post<3.1 6/26 hepatitis B virus DNA 14,200 6/26 hepatitis B core total antibody positive    Antimicrobials: Anti-infectives (From admission, onward)   Start     Ordered Stop   07/18/19 1000  cefTRIAXone (ROCEPHIN) 1 g in sodium chloride 0.9 % 100 mL IVPB  Status:  Discontinued        07/18/19 0905  07/20/19 1230       Devices    LINES / TUBES:      Continuous Infusions: . sodium chloride 250 mL (07/19/19 1721)  . sodium chloride    . sodium chloride       Objective: Vitals:   07/21/19 2032 07/22/19 0405 07/22/19 0830 07/22/19 1500  BP: 133/66 (!) 139/51 (!) 145/64 (!) 129/50  Pulse: 92 75 81 74  Resp: 15 15 15 16   Temp: 98.2 F (36.8 C) 98.4 F (36.9 C) 98.1 F (36.7 C) 97.7 F (36.5 C)  TempSrc: Oral Oral Oral Oral  SpO2: 100% 100% 100% 100%  Weight:  48.1 kg    Height:  Intake/Output Summary (Last 24 hours) at 07/22/2019 1627 Last data filed at 07/21/2019 1800 Gross per 24 hour  Intake 769 ml  Output 200 ml  Net 569 ml   Filed Weights   07/20/19 1824 07/21/19 0438 07/22/19 0405  Weight: 46.7 kg 47.5 kg 48.1 kg   Physical Exam:  General: Patient alert but less responsive.  Follows commands.  No acute respiratory distress Eyes: negative scleral hemorrhage, negative anisocoria, negative icterus ENT: Negative Runny nose, negative gingival bleeding, Neck:  Negative scars, masses, torticollis, lymphadenopathy, JVD Lungs: Clear to auscultation bilaterally without wheezes or crackles Cardiovascular: Regular rate and rhythm without murmur gallop or rub normal S1 and S2 Abdomen: negative abdominal pain, nondistended, positive soft, bowel sounds, no rebound, no ascites, no appreciable mass Extremities: No significant cyanosis, clubbing, or edema bilateral lower extremities Skin: Negative rashes, lesions, ulcers Psychiatric:  Negative depression, negative anxiety, negative fatigue, negative mania  Central nervous system:  Cranial nerves II through XII intact, tongue/uvula midline, all extremities muscle strength 5/5, sensation intact throughout,negative dysarthria, negative expressive aphasia, negative receptive aphasia. .     Data Reviewed: Care during the described time interval was provided by me .  I have reviewed this patient's available data,  including medical history, events of note, physical examination, and all test results as part of my evaluation.  CBC: Recent Labs  Lab 07/18/19 0435 07/19/19 0401 07/20/19 0408 07/21/19 0441 07/22/19 0928  WBC 8.2 7.3 6.3 6.4 8.6  NEUTROABS 6.9 5.5 4.8 4.8 7.1  HGB 8.8* 8.7* 8.0* 8.4* 9.0*  HCT 27.8* 27.6* 25.5* 26.6* 28.9*  MCV 62.2* 62.3* 63.0* 63.9* 64.1*  PLT 83* 83* 73* 77* 287*   Basic Metabolic Panel: Recent Labs  Lab 07/18/19 0435 07/18/19 0435 07/19/19 0401 07/20/19 0408 07/20/19 0938 07/21/19 0441 07/22/19 0928  NA 128*  --  135 134*  --  135 133*  K 4.6  --  3.5 3.1*  --  3.9 3.8  CL 98  --  99 99  --  99 98  CO2 21*  --  25 26  --  30 28  GLUCOSE 143*  --  77 98  --  135* 127*  BUN 37*  --  18 9  --  5* 15  CREATININE 6.02*  --  3.71* 2.56*  --  1.73* 2.68*  CALCIUM 8.9  --  8.8* 8.3*  --  8.7* 8.9  MG 2.2   < > 2.0 1.8 1.7 2.3 2.4  PHOS 3.9  --  3.5 2.4*  --  1.7* 3.8   < > = values in this interval not displayed.   GFR: Estimated Creatinine Clearance: 17.8 mL/min (A) (by C-G formula based on SCr of 2.68 mg/dL (H)). Liver Function Tests: Recent Labs  Lab 07/18/19 0435 07/19/19 0401 07/20/19 0408 07/21/19 0441 07/22/19 0928  AST 40 24 26 25 30   ALT 18 21 21 22 25   ALKPHOS 45 45 47 43 60  BILITOT 0.8 0.7 0.7 0.8 1.1  PROT 5.1* 5.1* 4.8* 5.0* 5.4*  ALBUMIN 2.7* 2.7* 2.4* 2.5* 2.8*   No results for input(s): LIPASE, AMYLASE in the last 168 hours. Recent Labs  Lab 07/17/19 1731  AMMONIA 22   Coagulation Profile: No results for input(s): INR, PROTIME in the last 168 hours. Cardiac Enzymes: No results for input(s): CKTOTAL, CKMB, CKMBINDEX, TROPONINI in the last 168 hours. BNP (last 3 results) No results for input(s): PROBNP in the last 8760 hours. HbA1C: No results for input(s): HGBA1C in  the last 72 hours. CBG: Recent Labs  Lab 07/21/19 1649 07/21/19 2027 07/22/19 0823 07/22/19 0845 07/22/19 1211  GLUCAP 326* 229* 67* 83 171*    Lipid Profile: No results for input(s): CHOL, HDL, LDLCALC, TRIG, CHOLHDL, LDLDIRECT in the last 72 hours. Thyroid Function Tests: No results for input(s): TSH, T4TOTAL, FREET4, T3FREE, THYROIDAB in the last 72 hours. Anemia Panel: No results for input(s): VITAMINB12, FOLATE, FERRITIN, TIBC, IRON, RETICCTPCT in the last 72 hours. Sepsis Labs: No results for input(s): PROCALCITON, LATICACIDVEN in the last 168 hours.  Recent Results (from the past 240 hour(s))  SARS Coronavirus 2 by RT PCR (hospital order, performed in [facility] hospital lab) Nasopharyngeal Nasopharyngeal Swab     Status: None   Collection Time: 07/13/19 10:30 PM   Specimen: Nasopharyngeal Swab  Result Value Ref Range Status   SARS Coronavirus 2 NEGATIVE NEGATIVE Final    Comment: (NOTE) SARS-CoV-2 target nucleic acids are NOT DETECTED.  The SARS-CoV-2 RNA is generally detectable in upper and lower respiratory specimens during the acute phase of infection. The lowest concentration of SARS-CoV-2 viral copies this assay can detect is 250 copies / mL. A negative result does not preclude SARS-CoV-2 infection and should not be used as the sole basis for treatment or other patient management decisions.  A negative result may occur with improper specimen collection / handling, submission of specimen other than nasopharyngeal swab, presence of viral mutation(s) within the areas targeted by this assay, and inadequate number of viral copies (<250 copies / mL). A negative result must be combined with clinical observations, patient history, and epidemiological information.  Fact Sheet for Patients:   StrictlyIdeas.no  Fact Sheet for Healthcare Providers: BankingDealers.co.za  This test is not yet approved or  cleared by the [place] FDA and has been authorized for detection and/or diagnosis of SARS-CoV-2 by FDA under an Emergency Use Authorization (EUA).  This EUA will  remain in effect (meaning this test can be used) for the duration of the COVID-19 declaration under Section 564(b)(1) of the Act, 21 U.S.C. section 360bbb-3(b)(1), unless the authorization is terminated or revoked sooner.  Performed at [facility], [address] [place], [place] [address]   MRSA PCR Screening     Status: None   Collection Time: 07/14/19 12:27 AM  Result Value Ref Range Status   MRSA by PCR NEGATIVE NEGATIVE Final    Comment:        The GeneXpert MRSA Assay (FDA approved for NASAL specimens only), is one component of a comprehensive MRSA colonization surveillance program. It is not intended to diagnose MRSA infection nor to guide or monitor treatment for MRSA infections. Performed at [facility], [place] [address] [place], [place] [address]   Culture, blood (routine x 2)     Status: None   Collection Time: 07/17/19  5:31 PM   Specimen: BLOOD  Result Value Ref Range Status   Specimen Description BLOOD RIGHT ANTECUBITAL  Final   Special Requests   Final    BOTTLES DRAWN AEROBIC ONLY Blood Culture adequate volume   Culture   Final    NO GROWTH 5 DAYS Performed at [facility], [place] [address], [place], [place] [address]    Report Status 07/22/2019 FINAL  Final  Culture, blood (routine x 2)     Status: None   Collection Time: 07/17/19  5:38 PM   Specimen: BLOOD LEFT WRIST  Result Value Ref Range Status   Specimen Description  BLOOD LEFT WRIST  Final   Special Requests   Final    BOTTLES DRAWN AEROBIC ONLY Blood Culture adequate volume   Culture   Final    NO GROWTH 5 DAYS Performed at [facility], [address], [place], [place] [address]    Report Status 07/22/2019 FINAL  Final  Culture, Urine     Status: Abnormal   Collection Time: 07/18/19  6:20 PM   Specimen: Urine, Random  Result Value Ref Range Status   Specimen Description URINE, RANDOM  Final   Special Requests NONE  Final   Culture (A)  Final    <10,000  COLONIES/mL INSIGNIFICANT GROWTH Performed at [facility], [place] [address] [place], [place] [address]    Report Status 07/20/2019 FINAL  Final         Radiology Studies: No results found.      Scheduled Meds: . amLODipine  10 mg Oral Daily  . aspirin EC  81 mg Oral Daily  . Chlorhexidine Gluconate Cloth  6 each Topical Q0600  . [START ON 07/23/2019] Chlorhexidine Gluconate Cloth  6 each Topical Q0600  . cholecalciferol  1,000 Units Oral Q1400  . clopidogrel  75 mg Oral Daily  . darbepoetin (ARANESP) injection - NON-DIALYSIS  60 mcg Subcutaneous Q Thu-1800  . febuxostat  40 mg Oral Q1400  . feeding supplement (NEPRO CARB STEADY)  237 mL Oral BID BM  . insulin aspart  0-6 Units Subcutaneous TID WC  . insulin aspart  2 Units Subcutaneous TID WC  . insulin detemir  3 Units Subcutaneous BID  . metoprolol tartrate  12.5 mg Oral BID  . multivitamin  1 tablet Oral QHS  . sodium chloride flush  3 mL Intravenous Q12H  . sodium chloride flush  3 mL Intravenous Q12H   Continuous Infusions: . sodium chloride 250 mL (07/19/19 1721)  . sodium chloride    . sodium chloride       LOS: 8 days    Time spent:40 min    Quida Glasser, Geraldo Docker, MD [facility] Pager [phone]  If 7PM-7AM, please contact night-coverage [email or web address] Password [facility] 07/22/2019, 4:27 PM

## 2019-07-22 NOTE — Progress Notes (Signed)
Renal Navigator aware of patient's situation per discussion with Dr. Bennye Alm late Friday afternoon, 07/19/19. Navigator followed up with Home Therapy Program regarding the capability to admit patient to Home Therapy for HHD straight from hospital. Unfortunately, there is not an appointment available in the [place] office to start [place] training until the end of September and it will be about a month before there is an appointment available in the [place] or [place] offices, if patient's family would be willing to drive her to either of those cities from their home in [place]. Patient will have to be referred for treatment at an OP HD clinic initially from hospital discharge.  Renal Navigator has updated Nephrology and will meet with patient and family to discuss.  Alphonzo Cruise, [place] Renal Navigator [phone]

## 2019-07-22 NOTE — Progress Notes (Signed)
Physical Therapy Treatment Patient Details Name: Crystal Jacobs MRN: [id number] DOB: 06-09-1960 Today's Date: 07/22/2019    History of Present Illness Pt adm with weakness and found to be hyponatremic and with orthostatic hypotension. PMH - DM, CKD, htn, nephrectomy. 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    PT Comments    Patient progressing with ambulation this session utilizing rollator with some improvement in veering, but needs frequent cues for safety to slow down and to watch around turns.  Feel she will need continued skilled PT for safety and training on this device as well as for increased independence.  PT to follow acutely and continue to recommend HHPT at d/c.    Follow Up Recommendations  Home health PT     Equipment Recommendations  Other (comment) (Rollator)    Recommendations for Other Services       Precautions / Restrictions Precautions Precautions: Fall Precaution Comments: watch orthostatics    Mobility  Bed Mobility Overal bed mobility: Needs Assistance Bed Mobility: Supine to Sit     Supine to sit: Supervision;HOB elevated     General bed mobility comments: increased time and effortful to sit up  Transfers Overall transfer level: Needs assistance Equipment used: Rolling walker (2 wheeled);4-wheeled walker Transfers: Sit to/from Stand Sit to Stand: Min assist         General transfer comment: assist to stand due to weakness  Ambulation/Gait Ambulation/Gait assistance: Min assist;Mod assist Gait Distance (Feet): 220 Feet (&130 w/ rollator) Assistive device: Rolling walker (2 wheeled);4-wheeled walker Gait Pattern/deviations: Step-through pattern;Decreased stride length;Drifts right/left     General Gait Details: ambulation with RW initially, pt reports it pulls L so assisted to keep stable and going forward while also assisting her for balance, then used rollator with less assist for walker, but cues and  assist for safety as pt goes faster, demonstrated afterward how to use brakes to keep from going too fast as well as staying closer to walker   Stairs             Wheelchair Mobility    Modified Rankin (Stroke Patients Only)       Balance Overall balance assessment: Needs assistance   Sitting balance-Leahy Scale: Good       Standing balance-Leahy Scale: Poor Standing balance comment: needs UE support for standing balance                            Cognition Arousal/Alertness: Awake/alert Behavior During Therapy: Flat affect Overall Cognitive Status: Impaired/Different from baseline Area of Impairment: Following commands;Safety/judgement;Problem solving                       Following Commands: Follows one step commands with increased time Safety/Judgement: Decreased awareness of safety   Problem Solving: Slow processing;Requires verbal cues General Comments: somewhat slow to respond to questions at times and jumps to conclusion that the walker is what makes her more off balance      Exercises      General Comments General comments (skin integrity, edema, etc.): daughter in room able to help with communication when she does not understand and letting me know some of her complaints      Pertinent Vitals/Pain Pain Assessment: No/denies pain    Home Living                      Prior Function  PT Goals (current goals can now be found in the care plan section) Progress towards PT goals: Progressing toward goals    Frequency    Min 3X/week      PT Plan Current plan remains appropriate    Co-evaluation              AM-PAC PT "6 Clicks" Mobility   Outcome Measure  Help needed turning from your back to your side while in a flat bed without using bedrails?: A Little Help needed moving from lying on your back to sitting on the side of a flat bed without using bedrails?: A Little Help needed moving to and  from a bed to a chair (including a wheelchair)?: A Little Help needed standing up from a chair using your arms (e.g., wheelchair or bedside chair)?: A Little Help needed to walk in hospital room?: A Little Help needed climbing 3-5 steps with a railing? : A Little 6 Click Score: 18    End of Session   Activity Tolerance: Patient tolerated treatment well Patient left: in bed;with call bell/phone within reach;with family/visitor present   PT Visit Diagnosis: Muscle weakness (generalized) (M62.81);Other abnormalities of gait and mobility (R26.89)     Time: 9471-2527 PT Time Calculation (min) (ACUTE ONLY): 18 min  Charges:  $Gait Training: 8-22 mins                     Magda Kiel, PT Acute Rehabilitation Services Pager:[phone] [phone] 07/22/2019    Reginia Naas 07/22/2019, 5:16 PM

## 2019-07-22 NOTE — Progress Notes (Addendum)
[facility] Progress Note    Assessment/ Plan:   1. Hyponatremia - Na+ 106 on admit 6/19 w/ orthostatic symptoms and recent N/V, diarrhea.  Now normalized   2. 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  Creat 6.5 > 5.5-- 6.2 here w/ IVF's.  Discussions with pt and son.  HD #1 6/24, HD #2 6/25 HD #3 6/26.  Improvement in symptoms with HD.  Nephrology has had multiple long discussions with pt, son, dtr re: ESRD.  Dialysis is life- sustaining at this point and stopping or even skipping rx confers greater risk of MI, CVA, uremic pericarditis, sleepiness, lethargy, buildup of minerals and uremic toxins, uremic coma, and death.   1. Continue HD per MWF schedule  2. Appreciate assistance of SW  3. Labs from today not yet available but pending - will follow-up 4. Strict ins/outs are ordered   3. Acute encephalopathy: Improving. CT head negative, ABG OK, ammonia OK, AXR without constipation, blood cultures neg.  UA on admission with bacteruria, empirically rx with Rocephin. Likely uremia.   4. Twitching: multifactorial but likely mineral imbalances.    5. Hep B +: S Ag positive, SAB, CAB, Hep B e, HBV PCR elevated - treatment per primary team   6. Hypovolemia -resolved  7. HyperK- resolved  8. IDDM - per primary team  9.  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 Dispo: pt and son/ dtr have expressed desire for home HD but regardless she would have to transition to an outpatient HD unit for ESRD care here.  SW is going to start CLIP process to see what her insurance will allow for.  She is currently visiting [place] on a 6 month VISA per charting  Subjective:    Strict ins/outs not available.  650 mL and three unmeasured voids over 6/27.  She states she thinks that twitching is better  Review of systems:  Denies current n/v Denies shortness of breath or chest pain    Objective:   BP (!) 145/64 (BP Location: Right Arm)    Pulse 81     Temp 98.1 F (36.7 C) (Oral)    Resp 15    Ht 5\' 2"  (1.575 m)    Wt 48.1 kg    LMP  (LMP Unknown)    SpO2 100%    BMI 19.40 kg/m   Intake/Output Summary (Last 24 hours) at 07/22/2019 1019 Last data filed at 07/21/2019 1800 Gross per 24 hour  Intake 819 ml  Output 450 ml  Net 369 ml   Weight change: 1.4 kg  Physical Exam: Gen: adult female in bed in NAD CVS: RRR Resp: clear and unlabored  Abd: soft nt nd Ext: no LE edema appreciated ACCESS: LUE AVF + T/B NEURO: AAOx 3, no twitching observed  Imaging: No results found.  Labs: BMET Recent Labs  Lab 07/17/19 1352 07/17/19 1731 07/17/19 2232 07/18/19 0435 07/19/19 0401 07/20/19 0408 07/21/19 0441  NA 129* 128* 130* 128* 135 134* 135  K 5.5* 4.5 5.0 4.6 3.5 3.1* 3.9  CL 100 97* 97* 98 99 99 99  CO2 17* 20* 22 21* 25 26 30   GLUCOSE 152* 173* 206* 143* 77 98 135*  BUN 39* 34* 37* 37* 18 9 5*  CREATININE 5.93* 5.87* 6.06* 6.02* 3.71* 2.56* 1.73*  CALCIUM 8.8* 9.2 9.6 8.9 8.8* 8.3* 8.7*  PHOS 3.5 3.7 3.8 3.9 3.5 2.4* 1.7*   CBC Recent Labs  Lab 07/18/19 0435  07/19/19 0401 07/20/19 0408 07/21/19 0441  WBC 8.2 7.3 6.3 6.4  NEUTROABS 6.9 5.5 4.8 4.8  HGB 8.8* 8.7* 8.0* 8.4*  HCT 27.8* 27.6* 25.5* 26.6*  MCV 62.2* 62.3* 63.0* 63.9*  PLT 83* 83* 73* 77*    Medications:     amLODipine  10 mg Oral Daily   aspirin EC  81 mg Oral Daily   Chlorhexidine Gluconate Cloth  6 each Topical Q0600   cholecalciferol  1,000 Units Oral Q1400   clopidogrel  75 mg Oral Daily   darbepoetin (ARANESP) injection - NON-DIALYSIS  60 mcg Subcutaneous Q Thu-1800   febuxostat  40 mg Oral Q1400   feeding supplement (NEPRO CARB STEADY)  237 mL Oral BID BM   insulin aspart  0-6 Units Subcutaneous TID WC   insulin aspart  2 Units Subcutaneous TID WC   insulin detemir  6 Units Subcutaneous QHS   metoprolol tartrate  12.5 mg Oral BID   multivitamin  1 tablet Oral QHS   sodium chloride flush  3 mL Intravenous Q12H   sodium  chloride flush  3 mL Intravenous Q12H      Claudia Desanctis, MD 07/22/2019, 10:41 AM   Addendum:  Spoke in the room with the patient's daughter and family.  They ask about dialysis once every 7 - 10 days per recommendations [place] her [place].  I let them know that HD was three times a week and we discussed risks of missing or shortening treatment including increased mortality.  Her creatinine prior to HD with progressive worsening of CKD was 6.   Informed later per SW that patient reportedly refused HD earlier this afternoon.  Spoke with HD charge RN to confirm same and to ask them to call for patient again.  Note hep B status - impacts staffing.    SW is working to see what insurance coverage she has for HD and if no coverage what the out of pocket costs would be for hemodialysis.  They have called for patient again and she has reportedly refused again per nursing on the floor.  Claudia Desanctis 07/22/2019 2:11 PM

## 2019-07-22 NOTE — Plan of Care (Signed)

## 2019-07-22 NOTE — Consult Note (Signed)
         [facility] for Infectious Disease    Date of Admission:  07/13/2019     Ms. Crystal Jacobs was hospitalized recently with malaise and was found to have progression of her chronic kidney disease.  She has just started hemodialysis.  Testing was done which showed that she has chronic hepatitis B.  This was first diagnosed in her native [place] about 15 years ago.  Her doctors have been monitoring her on an ongoing basis and she has been told that she does not need treatment.  She is hepatitis B e antigen negative.  Her liver enzymes here are normal.  Her hepatitis B DNA viral load is 14,200.  I would normally consider treating patients with e antigen negative hepatitis B and a viral load greater than 2000.  I discussed this option with her, her daughter and her son.  They all prefer that she simply return to [place] for further evaluation of her hepatitis B.  I will not follow her here.         Michel Bickers, MD [facility] [phone] pager   [phone] cell 07/22/2019, 2:25 PM

## 2019-07-22 NOTE — Progress Notes (Signed)
CBG 69 Son refused OJ . Explained the reason why she needed it. He agreed to let her drink it.

## 2019-07-22 NOTE — Progress Notes (Signed)
Inpatient Diabetes Program Recommendations  AACE/ADA: New Consensus Statement on Inpatient Glycemic Control (2015)  Target Ranges:  Prepandial:   less than 140 mg/dL      Peak postprandial:   less than 180 mg/dL (1-2 hours)      Critically ill patients:  140 - 180 mg/dL   Lab Results  Component Value Date   GLUCAP 171 (H) 07/22/2019   HGBA1C 6.2 (H) 07/14/2019    Review of Glycemic Control Results for Crystal Jacobs, Crystal Jacobs (MRN [id number]) as of 07/22/2019 13:01  Ref. Range 07/21/2019 06:38 07/21/2019 08:36 07/21/2019 12:37 07/21/2019 16:49 07/21/2019 20:27 07/22/2019 08:23 07/22/2019 08:45 07/22/2019 12:11  Glucose-Capillary Latest Ref Range: 70 - 99 mg/dL 101 (H) 100 (H) 259 (H) 326 (H) 229 (H) 67 (L) 83 171 (H)  Diabetes history: DM 2/ ESRD Outpatient Diabetes medications:  Lantus 4 units bid Novolog 2-6 units tid Current orders: Levemir 6 units q HS, Novolog 2 units tid with meals (hold if patient eats less than 50%), Novolog 0-6 units tid with meals  Inpatient Diabetes Program Recommendations:    May consider splitting Levemir to 3 units bid to ensure that it is lasting 24 hours and also to prevent AM hypoglycemia.   Thanks  Adah Perl, RN, BC-ADM Inpatient Diabetes Coordinator Pager [phone] (8a-5p)

## 2019-07-22 NOTE — Plan of Care (Signed)

## 2019-07-23 DIAGNOSIS — Z7189 Other specified counseling: Secondary | ICD-10-CM

## 2019-07-23 DIAGNOSIS — Z515 Encounter for palliative care: Secondary | ICD-10-CM

## 2019-07-23 DIAGNOSIS — N186 End stage renal disease: Secondary | ICD-10-CM

## 2019-07-23 LAB — CBC WITH DIFFERENTIAL/PLATELET
Abs Immature Granulocytes: 0.05 10*3/uL (ref 0.00–0.07)
Basophils Absolute: 0 10*3/uL (ref 0.0–0.1)
Basophils Relative: 1 %
Eosinophils Absolute: 0 10*3/uL (ref 0.0–0.5)
Eosinophils Relative: 0 %
HCT: 23.7 % — ABNORMAL LOW (ref 36.0–46.0)
Hemoglobin: 7.5 g/dL — ABNORMAL LOW (ref 12.0–15.0)
Immature Granulocytes: 1 %
Lymphocytes Relative: 13 %
Lymphs Abs: 1 10*3/uL (ref 0.7–4.0)
MCH: 20.4 pg — ABNORMAL LOW (ref 26.0–34.0)
MCHC: 31.6 g/dL (ref 30.0–36.0)
MCV: 64.6 fL — ABNORMAL LOW (ref 80.0–100.0)
Monocytes Absolute: 0.5 10*3/uL (ref 0.1–1.0)
Monocytes Relative: 7 %
Neutro Abs: 5.8 10*3/uL (ref 1.7–7.7)
Neutrophils Relative %: 78 %
Platelets: 92 10*3/uL — ABNORMAL LOW (ref 150–400)
RBC: 3.67 MIL/uL — ABNORMAL LOW (ref 3.87–5.11)
RDW: 18.1 % — ABNORMAL HIGH (ref 11.5–15.5)
WBC: 7.4 10*3/uL (ref 4.0–10.5)
nRBC: 0.4 % — ABNORMAL HIGH (ref 0.0–0.2)

## 2019-07-23 LAB — GLUCOSE, CAPILLARY
Glucose-Capillary: 87 mg/dL (ref 70–99)
Glucose-Capillary: 102 mg/dL — ABNORMAL HIGH (ref 70–99)
Glucose-Capillary: 302 mg/dL — ABNORMAL HIGH (ref 70–99)
Glucose-Capillary: 113 mg/dL — ABNORMAL HIGH (ref 70–99)

## 2019-07-23 LAB — BASIC METABOLIC PANEL
Anion gap: 10 (ref 5–15)
BUN: 19 mg/dL (ref 6–20)
CO2: 24 mmol/L (ref 22–32)
Calcium: 8.3 mg/dL — ABNORMAL LOW (ref 8.9–10.3)
Chloride: 99 mmol/L (ref 98–111)
Creatinine, Ser: 3.26 mg/dL — ABNORMAL HIGH (ref 0.44–1.00)
GFR calc Af Amer: 17 mL/min — ABNORMAL LOW (ref >60)
GFR calc non Af Amer: 15 mL/min — ABNORMAL LOW (ref >60)
Glucose, Bld: 83 mg/dL (ref 70–99)
Potassium: 4.4 mmol/L (ref 3.5–5.1)
Sodium: 133 mmol/L — ABNORMAL LOW (ref 135–145)

## 2019-07-23 LAB — PHOSPHORUS: Phosphorus: 4.2 mg/dL (ref 2.5–4.6)

## 2019-07-23 LAB — MAGNESIUM: Magnesium: 2.3 mg/dL (ref 1.7–2.4)

## 2019-07-23 MED ORDER — DARBEPOETIN ALFA 100 MCG/0.5ML IJ SOSY
100.0000 ug | PREFILLED_SYRINGE | INTRAMUSCULAR | Status: DC
  Filled 2019-07-23: qty 0.5

## 2019-07-23 NOTE — Progress Notes (Signed)
Nutrition Follow-up  DOCUMENTATION CODES:   Underweight  INTERVENTION:   -Continue Renal MVI daily -Continue Nepro Shake po BID, each supplement provides 425 kcal and 19 grams protein -Continue Magic cup TID with meals, each supplement provides 290 kcal and 9 grams of protein - Attached "Healthy Food Pyramid for Patients With Kidney Disease" handout from AVS/ discharge summary  NUTRITION DIAGNOSIS:   Inadequate oral intake related to poor appetite as evidenced by per patient/family report.  Ongoing  GOAL:   Patient will meet greater than or equal to 90% of their needs  Progressing   MONITOR:   PO intake, Supplement acceptance, Labs, Weight trends, Skin, I & O's  REASON FOR ASSESSMENT:   Consult;  Assessment of nutrition requirement/status  ASSESSMENT:   Crystal Jacobs is a 59 y.o. female with reported medical history significant for insulin-dependent type 2 diabetes, hypertension, CKD stage V, reported history of nephrectomy who presented to [place] ED for evaluation of generalized weakness  6/24- first HD initiated  Reviewed I/O's: -180 ml x 24 hours and +5.5 L since admission  UOP: 300 ml x 24 hours  Attempted to speak with pt today, however, in with MD at time of visit.   RD has made multiple attempts to speak with pt and family during hospitalization, however, difficult to reach due to in with other providers and unsuccessful attempts to speak via room phone.   Palliative care consult pending; noted pt has refused HD for the past two days. Renal Navigator has been investigating options for patient and family. Per note today, plan is for patient to go back to [place] this weekend to resume care with her primary nephrologist.   Pt remains with variable appetite; noted meal completion 25-75%. Pt with intermittent acceptance of Nepro supplements.   Labs reviewed: Na: 133, CBGS: 117-162 (inpatient orders for glycemic control are 0-6 units insulin  aspart TID with mealls, 2 units insulin aspart TID with meals, and 3 units insulin detemir BID).   Diet Order:   Diet Order            Diet renal with fluid restriction Fluid restriction: 2000 mL Fluid; Room service appropriate? Yes; Fluid consistency: Thin  Diet effective now                 EDUCATION NEEDS:   No education needs have been identified at this time  Skin:  Skin Assessment: Reviewed RN Assessment  Last BM:  07/22/19  Height:   Ht Readings from Last 1 Encounters:  07/13/19 5\' 2"  (1.575 m)    Weight:   Wt Readings from Last 1 Encounters:  07/23/19 50.7 kg    Ideal Body Weight:  50 kg  BMI:  Body mass index is 20.44 kg/m.  Estimated Nutritional Needs:   Kcal:  1350-1550  Protein:  65-80 grams  Fluid:  1000 ml + UOP    Loistine Chance, RD, LDN, CDCES Registered Dietitian II Certified Diabetes Care and Education Specialist Please refer to [facility] for RD and/or RD on-call/weekend/after hours pager

## 2019-07-23 NOTE — Progress Notes (Signed)
PROGRESS NOTE    Crystal Jacobs  ZHY:[id number] DOB: 1960-12-22 DOA: 07/13/2019 PCP: Patient, No Pcp Per     Brief Narrative:  59 y.o. female from [place] PMHx DM type II uncontrolled with complication, HTN, CKD stage V, s/p Nephrectomy who presented to [facility] ED for evaluation of generalized weakness.  History is supplemented by daughter at bedside.  About 2 weeks ago patient began to feel generally weak.  She had been having constipation and about 1 week ago she took Dulcolax.  The following day she had 9 bowel movements which have reportedly normalized since then.  She has however been feeling very weak and unable to perform the usual amount of activity she is used to.  She says she has had poor appetite and low oral intake.  She has had some nausea and vomiting.  She says she has a history of CKD stage V weight loss reported creatinine 7.7 in April 2021.  She says she is makes a small amount of urine without dysuria.  She says she usually gets injections to keep her blood counts up.  She denies any obvious bleeding.  She says she now again has decreased bowel movements than expected.  She is visiting from [place] and arrived 1 month ago.  She has been on a low-sodium diet.  She has a medication list which reads as follows:  Lantus 8 units once a day Novorapid 6 units daily Pantoprazole Multivitamin Minipress XL 5 mg twice daily Sodium bicarb 500 twice daily Febuxostat40 mg daily for gout Clopitab (Plavix/ASA 75/10) daily Cilnidipine 10/50 9 am and 9 pm   Subjective: 6/29 A/O x4, negative CP, negative S OB, negative abdominal pain, negative N/V    Assessment & Plan: Covid vaccination; +1/2 vaccination    Active Problems:   Hyponatremia   CKD (chronic kidney disease), stage V (HCC)   Microcytic anemia   Thrombocytopenia (HCC)   Insulin dependent type 2 diabetes mellitus ([place])   Hypochloremia   Hypertension   Chronic hepatitis B ([place])  Acute altered mental  status/Acute Metabolic Encephalopathy -Per daughter even though sodium has mostly corrected patient's mental status has worsened in the past 24 to 48 hours.  States mother is talking gibberish unable to answer question coherently.  This a.m. combative unable to coherently answer questions. -CT head negative for acute findings see results below -Ammonia WNL -Pan culture negative to date -ABG does not account for patient's altered mental status. -6/24 patient has obtain head CT decrease Haldol 1 mg BID. PRN -6/24 NO MEDICATION FOR SLEEPING.  PRIMARY TEAM only to change medication. -6/27 resolved.  Some tongue fasciculations and asterixis which is secondary to patient being a brand-new HD patient.  Patient and son counseled that not therapeutic yet on HD machine. -6/28 spoke with daughter recommended that we continue HD, until LCSW Crystal Jacobs can obtain quotes on the cost of out-of-pocket HD in town.  Hopefully by tomorrow -6/29 although patient had agreed to HD today along with her daughter.. Her Crystal Jacobs (son) again interjected himself into her care.  HE rejected her having HD today.  I spoke with son and asked if he had paperwork from his mother showing that she had surrendered her medical decision making rights to him, and he stated no.  I explained toTejasvi (son) that I was not his physician and that I was his mother's physician and if he interfered with what her wishes were he would be removed from the hospital. -Upon rounding patient stated her  wishes were to continue HD until her Crystal Jacobs (daughter) could rearrange her travel plans back to [place] hopefully within the next week.  Severe hypovolemic Hyponatremia/Hypochloremia: -Hold all patient's sodium has improved mental status has not in fact has worsened.   Recent Labs  Lab 07/19/19 0401 07/20/19 0408 07/21/19 0441 07/22/19 0928 07/23/19 0707  NA 135 134* 135 133* 133*  -Per daughter who has been at her bedside patient's mental status has  continued to deteriorate in the last 48 to 24 hours. -6/24 per daughter patient is improving cognition wise but not quite back to baseline.  I agree after exam that her cognition has improved today. -6/25 resolved  Essential HTN -6/23 Amlodipine 10 mg daily -6/24 normalize BP.  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 -6/24 first day of HD Recent Labs  Lab 07/19/19 0401 07/20/19 0408 07/21/19 0441 07/22/19 0928 07/23/19 0707  CREATININE 3.71* 2.56* 1.73* 2.68* 3.26*  -Per Dr. Hollie Jacobs patient's remaining kidney only working at 2 to 3%.  Patient considered ESRD -6/29 Dr. Royce Jacobs Nephrology is trying to rearrange schedule so patient can receive HD today or in the morning.  Chronic thrombocytopenia: -At home she takes aspirin and Plavix. -Thrombocytopenia slowly trending up..  No signs or symptoms of overt bleeding.  Monitor closely. .-Most likely reactive.   -Continue to hold all heparin products  -6/23 HIT panel negative  Microcytic anemia anemia of chronic renal disease: She states she has been receiving erythropoietin as an outpatient. Anemia panel is pending, her current hemoglobin is 12.5. Recent Labs  Lab 07/19/19 0401 07/20/19 0408 07/21/19 0441 07/22/19 0928 07/23/19 0707  HGB 8.7* 8.0* 8.4* 9.0* 7.5*  -Hemoccult pending  DM type II controlled with complication -8/45 hemoglobin A1c = 6.2  -6/28 change Levemir 3 units BID  -NovoLog 2 units qac  Agitation/Anxiety -6/24 Haldol 2 mg BID PRN  Metabolic acidosis? -Resolved  Hypokalemia/Hyperkalemia: -Stable  Hypomagnesmia -Stable  Hypophosphatemia -Stable  Underweight: Continue her renal diet.  Bacteriuria -6/24 start empiric antibiotics, urine is dirty.  Urine culture pending  Chronic  hepatitis B -Hepatitis B surface antigen/core antibody positive -6/26 will obtain hepatitis B surface antibody -6/26 obtain hepatitis B viral load 14,200 -6/28 consulted ID as it appears patient is a chronic hepatitis B carrier -6/28 Dr. Michel Bickers, ID discussed treating patient with son and daughter and they declined treatment, preferring that patient return to [place] for further evaluation of her hepatitis B.    Goals of care -6/26 son and to some extent daughter cannot understand what has occurred, and have unrealistic expectations. -Each day the son asks the exact same questions in a slightly different manner.  -Son cannot understand that there is no single reason for patient's ONLY remaining kidney to be failing and therefore patient requires hemodialysis to survive.  This has been explained to patient in detail by myself and Dr. Hollie Jacobs nephrologist.  -Son unable to understand that since there is no SINGLE cause of his mother's cognitive decline, there is no simple treatment plan that will immediately resolve all of her issues.  -Son revealed to me today 6/26 that family had been intentionally withholding sodium, calcium, magnesium, potassium from patient's diet.  Son unable to understand that these are ABSOLUTELY CRITICAL elements for the body to function correctly.  -  Sons unrealistic expectations have led him to believe that patient can obtain kidney transplant in a short period of time, that hemodialysis with only 2 or 3 days of treatment will return patient to baseline even though family admits patient has been deteriorating over a couple of weeks minimal.  -Patient's cognition has significantly improved with minimal hemodialysis however patient needs to continue.  In order to obtain maximum benefit.  Son and daughter have decided to terminate any future HD.  -Family unable to understand that a medical treatment plan is dynamic, and believe that any plan that may have been discussed should  be written in stone..  -Dr. Hollie Jacobs nephrology and myself have spent extensive amount of time trying to explain the necessity of the treatment plan.  Had to inform son today that it was impossible to explain the whole pathophysiology of the human body and why it needed to have all of these elements to run and that the kidney was extremely important for management of these elements and for filtration of toxins.  Counseled family they needed to trust that if Dr. Hollie Jacobs and myself agreed on a treatment plan and that it was in the best interest of the patient, however it was in their rights to refuse treatment..  -Son and daughter have decided that today's HD treatment will be patient's last against the advice of myself and Dr. Hollie Jacobs.  We will speak with family in the A.m.  -Will consult palliative care for goals of care; home hospice vs residential hospice.       DVT prophylaxis: SCD Code Status: Full Family Communication: 6/24 spoke with daughter and son discussed at length with them that there is usually not just one cause for problem such as her mother is having.  Son is very keen on pinning down just 1 reason for his mother being ill and having cognitive changes.  Again discussed at length reason for requiring hemodialysis.  Brother and daughter inquired into if they wanted to stop hemodialysis what are risk factors and I informed them there was a risk of DEATH.  Brother and sister decided that today would be the last day of HD.  I informed Dr. Hollie Jacobs nephrology of this family decision Status is: Inpatient    Dispo: The patient is from: Home              Anticipated d/c is to: Home              Anticipated d/c date is: 6/30              Patient currently unstable      Consultants:  Nephrology Dr. Hollie Jacobs  ID Dr. Michel Bickers    Procedures/Significant Events:  6/24 CT head W0 contrast; chronic small vessel disease without acute intracranial abnormality    I have personally reviewed and  interpreted all radiology studies and my findings are as above.  VENTILATOR SETTINGS:    Cultures 6/19 SARS coronavirus negative 6/20 MRSA by PCR negative 6/20 HIV nonreactive 6/23 blood RIGHT AC NGTD 6/23 blood LEFT wrist NGTD 6/24 urine insignificant growth 6/24 hepatitis B surface antigen positive 6/24 hepatitis B core total antibody positive 6/26 hepatitis B E antigen negative 6/26 hepatitis B post<3.1 6/26 hepatitis B virus DNA 14,200 6/26 hepatitis B core total antibody positive    Antimicrobials: Anti-infectives (From admission, onward)   Start     Ordered Stop   07/18/19 1000  cefTRIAXone (ROCEPHIN) 1 g in sodium chloride 0.9 % 100 mL  IVPB  Status:  Discontinued        07/18/19 0905 07/20/19 1230       Devices    LINES / TUBES:      Continuous Infusions: . sodium chloride 250 mL (07/19/19 1721)  . sodium chloride    . sodium chloride       Objective: Vitals:   07/22/19 1500 07/22/19 2203 07/23/19 0515 07/23/19 0740  BP: (!) 129/50 (!) 142/62 (!) 139/52 (!) 131/53  Pulse: 74 77 77 72  Resp: 16 18 17 16   Temp: 97.7 F (36.5 C) 98.2 F (36.8 C) 98.3 F (36.8 C) 98.2 F (36.8 C)  TempSrc: Oral Oral Oral Oral  SpO2: 100% 100% 100% 100%  Weight:   50.7 kg   Height:        Intake/Output Summary (Last 24 hours) at 07/23/2019 1431 Last data filed at 07/23/2019 1339 Gross per 24 hour  Intake 120 ml  Output 300 ml  Net -180 ml   Filed Weights   07/21/19 0438 07/22/19 0405 07/23/19 0515  Weight: 47.5 kg 48.1 kg 50.7 kg     Physical Exam:  General: A/O x4, no acute respiratory distress Eyes: negative scleral hemorrhage, negative anisocoria, negative icterus ENT: Negative Runny nose, negative gingival bleeding, Neck:  Negative scars, masses, torticollis, lymphadenopathy, JVD Lungs: Clear to auscultation bilaterally without wheezes or crackles Cardiovascular: Regular rate and rhythm without murmur gallop or rub normal S1 and S2 Abdomen:  negative abdominal pain, nondistended, positive soft, bowel sounds, no rebound, no ascites, no appreciable mass Extremities: No significant cyanosis, clubbing, or edema bilateral lower extremities Skin: Negative rashes, lesions, ulcers Psychiatric:  Negative depression, negative anxiety, negative fatigue, negative mania  Central nervous system:  Cranial nerves II through XII intact, tongue/uvula midline, all extremities muscle strength 5/5, sensation intact throughout, negative dysarthria, negative expressive aphasia, negative receptive aphasia.   Data Reviewed: Care during the described time interval was provided by me .  I have reviewed this patient's available data, including medical history, events of note, physical examination, and all test results as part of my evaluation.  CBC: Recent Labs  Lab 07/19/19 0401 07/20/19 0408 07/21/19 0441 07/22/19 0928 07/23/19 0707  WBC 7.3 6.3 6.4 8.6 7.4  NEUTROABS 5.5 4.8 4.8 7.1 5.8  HGB 8.7* 8.0* 8.4* 9.0* 7.5*  HCT 27.6* 25.5* 26.6* 28.9* 23.7*  MCV 62.3* 63.0* 63.9* 64.1* 64.6*  PLT 83* 73* 77* 101* 92*   Basic Metabolic Panel: Recent Labs  Lab 07/19/19 0401 07/19/19 0401 07/20/19 0408 07/20/19 0938 07/21/19 0441 07/22/19 0928 07/23/19 0707  NA 135  --  134*  --  135 133* 133*  K 3.5  --  3.1*  --  3.9 3.8 4.4  CL 99  --  99  --  99 98 99  CO2 25  --  26  --  30 28 24   GLUCOSE 77  --  98  --  135* 127* 83  BUN 18  --  9  --  5* 15 19  CREATININE 3.71*  --  2.56*  --  1.73* 2.68* 3.26*  CALCIUM 8.8*  --  8.3*  --  8.7* 8.9 8.3*  MG 2.0   < > 1.8 1.7 2.3 2.4 2.3  PHOS 3.5  --  2.4*  --  1.7* 3.8 4.2   < > = values in this interval not displayed.   GFR: Estimated Creatinine Clearance: 15.2 mL/min (A) (by C-G formula based on SCr of 3.26 mg/dL (H)).  Liver Function Tests: Recent Labs  Lab 07/18/19 0435 07/19/19 0401 07/20/19 0408 07/21/19 0441 07/22/19 0928  AST 40 24 26 25 30   ALT 18 21 21 22 25   ALKPHOS 45 45 47 43  60  BILITOT 0.8 0.7 0.7 0.8 1.1  PROT 5.1* 5.1* 4.8* 5.0* 5.4*  ALBUMIN 2.7* 2.7* 2.4* 2.5* 2.8*   No results for input(s): LIPASE, AMYLASE in the last 168 hours. Recent Labs  Lab 07/17/19 1731  AMMONIA 22   Coagulation Profile: No results for input(s): INR, PROTIME in the last 168 hours. Cardiac Enzymes: No results for input(s): CKTOTAL, CKMB, CKMBINDEX, TROPONINI in the last 168 hours. BNP (last 3 results) No results for input(s): PROBNP in the last 8760 hours. HbA1C: No results for input(s): HGBA1C in the last 72 hours. CBG: Recent Labs  Lab 07/22/19 1211 07/22/19 1659 07/22/19 2212 07/23/19 0742 07/23/19 1130  GLUCAP 171* 162* 117* 87 102*   Lipid Profile: No results for input(s): CHOL, HDL, LDLCALC, TRIG, CHOLHDL, LDLDIRECT in the last 72 hours. Thyroid Function Tests: No results for input(s): TSH, T4TOTAL, FREET4, T3FREE, THYROIDAB in the last 72 hours. Anemia Panel: No results for input(s): VITAMINB12, FOLATE, FERRITIN, TIBC, IRON, RETICCTPCT in the last 72 hours. Sepsis Labs: No results for input(s): PROCALCITON, LATICACIDVEN in the last 168 hours.  Recent Results (from the past 240 hour(s))  SARS Coronavirus 2 by RT PCR (hospital order, performed in [facility] hospital lab) Nasopharyngeal Nasopharyngeal Swab     Status: None   Collection Time: 07/13/19 10:30 PM   Specimen: Nasopharyngeal Swab  Result Value Ref Range Status   SARS Coronavirus 2 NEGATIVE NEGATIVE Final    Comment: (NOTE) SARS-CoV-2 target nucleic acids are NOT DETECTED.  The SARS-CoV-2 RNA is generally detectable in upper and lower respiratory specimens during the acute phase of infection. The lowest concentration of SARS-CoV-2 viral copies this assay can detect is 250 copies / mL. A negative result does not preclude SARS-CoV-2 infection and should not be used as the sole basis for treatment or other patient management decisions.  A negative result may occur with improper specimen  collection / handling, submission of specimen other than nasopharyngeal swab, presence of viral mutation(s) within the areas targeted by this assay, and inadequate number of viral copies (<250 copies / mL). A negative result must be combined with clinical observations, patient history, and epidemiological information.  Fact Sheet for Patients:   StrictlyIdeas.no  Fact Sheet for Healthcare Providers: BankingDealers.co.za  This test is not yet approved or  cleared by the [place] FDA and has been authorized for detection and/or diagnosis of SARS-CoV-2 by FDA under an Emergency Use Authorization (EUA).  This EUA will remain in effect (meaning this test can be used) for the duration of the COVID-19 declaration under Section 564(b)(1) of the Act, 21 U.S.C. section 360bbb-3(b)(1), unless the authorization is terminated or revoked sooner.  Performed at [facility], [address] [place], [place] [address]   MRSA PCR Screening     Status: None   Collection Time: 07/14/19 12:27 AM  Result Value Ref Range Status   MRSA by PCR NEGATIVE NEGATIVE Final    Comment:        The GeneXpert MRSA Assay (FDA approved for NASAL specimens only), is one component of a comprehensive MRSA colonization surveillance program. It is not intended to diagnose MRSA infection nor to guide or monitor treatment for MRSA infections. Performed at [facility], [place] [address]  [place], [place] [address]   Culture, blood (routine x 2)     Status: None   Collection Time: 07/17/19  5:31 PM   Specimen: BLOOD  Result Value Ref Range Status   Specimen Description BLOOD RIGHT ANTECUBITAL  Final   Special Requests   Final    BOTTLES DRAWN AEROBIC ONLY Blood Culture adequate volume   Culture   Final    NO GROWTH 5 DAYS Performed at [facility], [address] [place], [place] [address]    Report Status 07/22/2019 FINAL  Final  Culture,  blood (routine x 2)     Status: None   Collection Time: 07/17/19  5:38 PM   Specimen: BLOOD LEFT WRIST  Result Value Ref Range Status   Specimen Description BLOOD LEFT WRIST  Final   Special Requests   Final    BOTTLES DRAWN AEROBIC ONLY Blood Culture adequate volume   Culture   Final    NO GROWTH 5 DAYS Performed at [facility], [place] [address], [place], [place] [address]    Report Status 07/22/2019 FINAL  Final  Culture, Urine     Status: Abnormal   Collection Time: 07/18/19  6:20 PM   Specimen: Urine, Random  Result Value Ref Range Status   Specimen Description URINE, RANDOM  Final   Special Requests NONE  Final   Culture (A)  Final    <10,000 COLONIES/mL INSIGNIFICANT GROWTH Performed at [facility], [place] [address], [place], [place] [address]    Report Status 07/20/2019 FINAL  Final         Radiology Studies: No results found.      Scheduled Meds: . amLODipine  10 mg Oral Daily  . aspirin EC  81 mg Oral Daily  . Chlorhexidine Gluconate Cloth  6 each Topical Q0600  . Chlorhexidine Gluconate Cloth  6 each Topical Q0600  . cholecalciferol  1,000 Units Oral Q1400  . clopidogrel  75 mg Oral Daily  . [START ON 07/25/2019] darbepoetin (ARANESP) injection - NON-DIALYSIS  100 mcg Subcutaneous Q Thu-1800  . febuxostat  40 mg Oral Q1400  . feeding supplement (NEPRO CARB STEADY)  237 mL Oral BID BM  . insulin aspart  0-6 Units Subcutaneous TID WC  . insulin aspart  2 Units Subcutaneous TID WC  . insulin detemir  3 Units Subcutaneous BID  . metoprolol tartrate  12.5 mg Oral BID  . multivitamin  1 tablet Oral QHS  . sodium chloride flush  3 mL Intravenous Q12H  . sodium chloride flush  3 mL Intravenous Q12H   Continuous Infusions: . sodium chloride 250 mL (07/19/19 1721)  . sodium chloride    . sodium chloride       LOS: 9 days    Time spent:40 min    Janie Strothman, Geraldo Docker, MD [facility] Pager [phone]  If 7PM-7AM, please contact  night-coverage [email or web address] Password [email or web address] 07/23/2019, 2:31 PM

## 2019-07-23 NOTE — Progress Notes (Signed)
Renal Navigator and PMT/A. Crystal Jacobs met with patient, son and daughter at patient's bedside today to further discuss plans. Navigator understands that patient's son has refused HD again today.  Prior to meeting, patient was in her room alone and Navigator assessed how well she is understanding information being exchanged. Patient states she reads and writes English better than she understands is, but that as long as people speak slowly, she can follow fine. Navigator stated how important it is that she understands all that is being discussed and to please let us know if she does not understand something. Navigator stated that we want to know her wishes, not only her family's wishes for her. Navigator also told patient that we can provide an Interpreter for her if she desires. She again states that she can understand [place] as long as we talk slowly. Patient reports that she is extremely concerned about the cost of treatment here and that she has full coverage for medical treatments in [place]. She is starting to feel she may need to return to [place] sooner than planned. Patient's son continues to ask questions as to why patient requires ongoing HD and reports today that his mother's Nephrologist in [place] planned to start his mother on HD only once every 7-10 days, which is why they have refused treatment today and yesterday in the hospital. He also continues to request information on HHD, which Navigator states is, unfortunately not an option straight from discharge. Navigator also states that even if she is able to transition to HHD at some point, they will still need to follow the treatment plan of a Nephrologist.  Throughout the discussion, patient maintains consistently that she is concerned about the cost of treatment here and that she desires to return to [place] here. At this point, patient's son asked about the out of pocket cost of OP HD if patient were to stay in [place]. Navigator informed them that [facility]  is the closest [employer] clinic with an available isolation seat (for +HepB) and that the cost of each treatment will be $527 per policy for a "Seasonal" patient. Navigator acknowledges that her children are asking questions and trying to make plans because they love their mother, but asked that they please not lose sight of what she wants. Navigator asked for quiet so that patient could state her wishes clearly to her children. She states that she wants to return to [place] and to the care of her own Nephrologist, whose care she has been under for over 2 years. Navigator asked patient's children to help their mother achieve the goal of getting back home now. They both agreed. PMT recommends that patient have HD to optimize her health prior to making the long trip back to [place]. She and her daughter agree (son was not in the room at this moment). Navigator very clearly stated to patient and daughter that Navigator needs to know the date and time of the flight (daughter states they will try to make it for this weekend), and that patient must agree to HD when she is called in order to remain in the hospital. Patient agreed, smiled and gave a thumbs up. Navigator states that it may not be feasible to get patient back in to the unit (especially since she requires tx in isolation) today and that it may be tomorrow. They stated understanding. PMT agreed to contact Attending and Navigator contacted Nephrologist to provide above update. Navigator remains available for support and assistance as needed. OP HD referral  canceled.   Crystal Jacobs, [place] [facility]al Navigator [phone]

## 2019-07-23 NOTE — Progress Notes (Signed)
[facility] Progress Note    Assessment/ Plan:   1. 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  Creat 6.5 > 5.5-- 6.2 here w/ IVF's.  Discussions with pt and son.  HD #1 6/24, HD #2 6/25 HD #3 6/26.  Improvement in symptoms with HD.  Nephrology has had multiple long discussions with pt, son, dtr re: ESRD.  Dialysis is life- sustaining at this point and stopping or even skipping rx confers greater risk of MI, CVA, uremic pericarditis, sleepiness, lethargy, buildup of minerals and uremic toxins, uremic coma, and death.   1. Recommend dialysis three times a week - she has refused yest and today.  2. She and her family are talking to come up with plan - she will let us know by 11:30 per their report.  She is leaning toward going to [place].  Have still recommended dialysis here while inpatient to optimize her prior to the trip should she return to [place]  3. Appreciate assistance of SW.  Patient is to be given an estimate for the cost of HD and if she is not able to obtain HD here then would recommend returning home to [place] to get HD there.   4. Strict ins/outs are ordered  2. Acute encephalopathy: Improving. CT head negative, ABG OK, ammonia OK, AXR without constipation, blood cultures neg.  UA on admission with bacteruria, empirically rx with Rocephin. Likely uremia.   3. Hyponatremia - Na+ 106 on admit 6/19 w/ orthostatic symptoms and recent N/V, diarrhea.  Now normalized   4. Twitching: multifactorial but likely mineral imbalances.    5. Chronic Hep B: S Ag positive, SAB, CAB, Hep B e, HBV PCR elevated - treatment per primary team   6. Hypovolemia -resolved  7. HyperK- resolved  8. IDDM - per primary team  9. Anemia CKD - Hemoglobin has trended down.  Continue ESA - increase next dose to 100 mcg aranesp from 60 mcg. Iron is replete    9.  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 Dispo: SW started CLIP process to see what outpatient  options she has.  She is currently visiting [place] on a 6 month VISA per charting.  Patient is to be given an estimate for the cost of HD and if she is not able to obtain HD here from a financial standpoint then would recommend returning to her home in [place]  Subjective:    She refused HD twice yesterday and her son is charted as refusing for her to be brought today.  The patient clarifies that she is refusing.  She is discussing options with family and is worried about cost of HD as she states can get HD for free in [place].   Review of systems:   Denies current n/v Denies shortness of breath or chest pain    Objective:   BP (!) 131/53 (BP Location: Right Arm)   Pulse 72   Temp 98.2 F (36.8 C) (Oral)   Resp 16   Ht 5\' 2"  (1.575 m)   Wt 50.7 kg   LMP  (LMP Unknown)   SpO2 100%   BMI 20.44 kg/m   Intake/Output Summary (Last 24 hours) at 07/23/2019 1038 Last data filed at 07/23/2019 0900 Gross per 24 hour  Intake 120 ml  Output --  Net 120 ml   Weight change: 2.6 kg  Physical Exam:   Gen: adult female in bed in NAD CVS: RRR Resp: clear and  unlabored  Abd: soft nt nd Ext: no LE edema appreciated ACCESS: LUE AVF + T/B NEURO: AAOx 3, no twitching observed Psych - normal mood and affect   Imaging: No results found.  Labs: BMET Recent Labs  Lab 07/17/19 1731 07/17/19 1731 07/17/19 2232 07/18/19 0435 07/19/19 0401 07/20/19 0408 07/21/19 0441 07/22/19 0928 07/23/19 0707  NA 128*  --  130* 128* 135 134* 135 133*  --   K 4.5  --  5.0 4.6 3.5 3.1* 3.9 3.8  --   CL 97*  --  97* 98 99 99 99 98  --   CO2 20*  --  22 21* 25 26 30 28   --   GLUCOSE 173*  --  206* 143* 77 98 135* 127*  --   BUN 34*  --  37* 37* 18 9 5* 15  --   CREATININE 5.87*  --  6.06* 6.02* 3.71* 2.56* 1.73* 2.68*  --   CALCIUM 9.2  --  9.6 8.9 8.8* 8.3* 8.7* 8.9  --   PHOS 3.7   < > 3.8 3.9 3.5 2.4* 1.7* 3.8 4.2   < > = values in this interval not displayed.   CBC Recent Labs  Lab 07/20/19 0408  07/21/19 0441 07/22/19 0928 07/23/19 0707  WBC 6.3 6.4 8.6 7.4  NEUTROABS 4.8 4.8 7.1 5.8  HGB 8.0* 8.4* 9.0* 7.5*  HCT 25.5* 26.6* 28.9* 23.7*  MCV 63.0* 63.9* 64.1* 64.6*  PLT 73* 77* 101* 92*    Medications:    . amLODipine  10 mg Oral Daily  . aspirin EC  81 mg Oral Daily  . Chlorhexidine Gluconate Cloth  6 each Topical Q0600  . Chlorhexidine Gluconate Cloth  6 each Topical Q0600  . cholecalciferol  1,000 Units Oral Q1400  . clopidogrel  75 mg Oral Daily  . darbepoetin (ARANESP) injection - NON-DIALYSIS  60 mcg Subcutaneous Q Thu-1800  . febuxostat  40 mg Oral Q1400  . feeding supplement (NEPRO CARB STEADY)  237 mL Oral BID BM  . insulin aspart  0-6 Units Subcutaneous TID WC  . insulin aspart  2 Units Subcutaneous TID WC  . insulin detemir  3 Units Subcutaneous BID  . metoprolol tartrate  12.5 mg Oral BID  . multivitamin  1 tablet Oral QHS  . sodium chloride flush  3 mL Intravenous Q12H  . sodium chloride flush  3 mL Intravenous Q12H      Claudia Desanctis, MD 07/23/2019, 11:01 AM

## 2019-07-23 NOTE — Progress Notes (Signed)
Telephoned primary nurse Elwanda Brooklyn, RN to receive report HD. Nurse informed pt. Son at bedside and son refuses tx at this time. This nurse will notify MD. Charge nurse Camelia Eng, RN aware

## 2019-07-23 NOTE — Consult Note (Addendum)
Consultation Note Date: 07/23/2019   Patient Name: Crystal Jacobs  DOB: 11-11-1960  MRN: [id number]  Age / Sex: 59 y.o., female  PCP: Patient, No Pcp Per Referring Physician: Allie Bossier, MD  Reason for Consultation: Establishing goals of care and Hospice Evaluation  HPI/Patient Profile: 59 y.o. female  with past medical history of insulin dependent diabetes type 2, hypertension, CKD stage V, history of nephrecomy admitted on 07/13/2019 with weakness and constipation followed by diarrhea and poor appetite and intake with nausea/vomiting. Reported that creatinine 7.7 in April 2021 and that she makes small amounts of urine without dysuria and has LUE fistula already in place. Found to have severe hyponatremia which has greatly improved. Ultimately dialysis was necessary to begin during admission with progression to ESRD.   Clinical Assessment and Goals of Care: I met today at Crystal Jacobs's bedside along with her son and renal navigator [place]. Brigitte Pulse, LCSW. Jaclyn Shaggy has already discussed with Crystal Jacobs and verified that she understand [place] speaking [place] and offered Interpretor. Crystal Jacobs explains that if we speak slowly she can understand well and will let us know if we say anything she does not understand.   We had a long conversation today and patient and family (joined by daughter and multiple family members placed on speaker phone as well) they feel that she does not really need dialysis. They request to stop dialysis and come back to the hospital if she becomes ill again. I explained that this is not medically recommended and would be her at risk for serious complications and if she became ill again there is no guarantee that we could get her improved to this point again. Throughout our conversation regarding renal function and symptoms and many questions family had - Crystal Jacobs expresses concern for  the cost of continuing dialysis and expresses her desire to return to [place].   We discussed all options and risks of each option. They all ultimately agree that she does want dialysis if this is what is needed to keep her alive. This is aligned with her previously placed fistula indicated plan for dialysis at some stage in her life. However, they have been in conversation with her nephrologist in [place] who also questions her need to have dialysis and recommends dialysis every 7-10 days per family report. Colleen and I explained that this is not an option here in the [place]. We discussed ultimate goal to return to [place] and recommended to consider continuing dialysis in the hospital where dialysis is very controlled and monitored and to book flight to [place] this week. Crystal Jacobs feels she is well enough to make the trip and is anxious to return and meet with her own nephrologist for plan. I reiterated my concern that she will need dialysis to maintain her health in order to allow her to make the journey back to [place] and without dialysis we risk return to [place] not being an option in the future.   Family requested an hour to discuss plan. When I returned they  confirm that they would like to continue dialysis as recommended in the hospital to ensure she stays well enough to make the trip back to [place]. She is concerned with cost as she is here on visa and has no insurance in the [place] and dialysis would have to be paid up front and out-of-pocket. All family as well as nephrologist in [place] agree with this plan. I spoke directly with Crystal Jacobs who agrees with plan and says she will agree to go to dialysis as recommended until flight that family are arranging for later this week. She will also have a family member with her when she flies. Crystal Jacobs gives me a thumbs up and says she has no questions or concerns about the plan. She confirms that this plan is her desire. Jaclyn Shaggy confirmed that she will agree to go  to dialysis when arranged for today or tomorrow and she agrees.   All questions/concerns addressed with patient and family at bedside and via telephone. Emotional support provided during difficult conversation and decisions.   Primary Decision Maker PATIENT    SUMMARY OF RECOMMENDATIONS   - Continue dialysis as recommended in the hospital with plan to optimize health for flight back to [place] by the end of the week and to follow up with her nephrologist in [place] upon her return.   Code Status/Advance Care Planning:  Full code   Symptom Management:   Per primary and nephrologist.   Prognosis:   Unable to determine  Discharge Planning: Inpatient dialysis until flight to [place] planned for the end of the week. Family to update navigator [place] with flight info when booked.      Primary Diagnoses: Present on Admission: . Hyponatremia . CKD (chronic kidney disease), stage V ([place]) . Microcytic anemia . Thrombocytopenia ([place]) . Hypochloremia . Hypertension . Chronic hepatitis B ([place])   I have reviewed the medical record, interviewed the patient and family, and examined the patient. The following aspects are pertinent.  Past Medical History:  Diagnosis Date  . Diabetes mellitus without complication ([place])   . Hypertension   . Renal disorder    Social History   Socioeconomic History  . Marital status: Married    Spouse name: Not on file  . Number of children: Not on file  . Years of education: Not on file  . Highest education level: Not on file  Occupational History  . Not on file  Tobacco Use  . Smoking status: Never Smoker  . Smokeless tobacco: Never Used  Substance and Sexual Activity  . Alcohol use: Not Currently  . Drug use: Not on file  . Sexual activity: Not on file  Other Topics Concern  . Not on file  Social History Narrative  . Not on file   Social Determinants of Health   Financial Resource Strain:   . Difficulty of Paying Living Expenses:    Food Insecurity:   . Worried About Charity fundraiser in the Last Year:   . Arboriculturist in the Last Year:   Transportation Needs:   . Film/video editor (Medical):   Marland Kitchen Lack of Transportation (Non-Medical):   Physical Activity:   . Days of Exercise per Week:   . Minutes of Exercise per Session:   Stress:   . Feeling of Stress :   Social Connections:   . Frequency of Communication with Friends and Family:   . Frequency of Social Gatherings with Friends and Family:   . Attends Religious Services:   .  Active Member of Clubs or Organizations:   . Attends Archivist Meetings:   Marland Kitchen Marital Status:    No family history on file. Scheduled Meds: . amLODipine  10 mg Oral Daily  . aspirin EC  81 mg Oral Daily  . Chlorhexidine Gluconate Cloth  6 each Topical Q0600  . Chlorhexidine Gluconate Cloth  6 each Topical Q0600  . cholecalciferol  1,000 Units Oral Q1400  . clopidogrel  75 mg Oral Daily  . darbepoetin (ARANESP) injection - NON-DIALYSIS  60 mcg Subcutaneous Q Thu-1800  . febuxostat  40 mg Oral Q1400  . feeding supplement (NEPRO CARB STEADY)  237 mL Oral BID BM  . insulin aspart  0-6 Units Subcutaneous TID WC  . insulin aspart  2 Units Subcutaneous TID WC  . insulin detemir  3 Units Subcutaneous BID  . metoprolol tartrate  12.5 mg Oral BID  . multivitamin  1 tablet Oral QHS  . sodium chloride flush  3 mL Intravenous Q12H  . sodium chloride flush  3 mL Intravenous Q12H   Continuous Infusions: . sodium chloride 250 mL (07/19/19 1721)  . sodium chloride    . sodium chloride     PRN Meds:.sodium chloride, sodium chloride, sodium chloride, acetaminophen **OR** acetaminophen, haloperidol lactate, hydrALAZINE, lidocaine (PF), lidocaine-prilocaine, LORazepam, ondansetron **OR** ondansetron (ZOFRAN) IV, pentafluoroprop-tetrafluoroeth, senna, sodium chloride flush Allergies  Allergen Reactions  . Heparin     Dr. Sherral Hammers ordered Heparin as a allergy for this patient    Review of Systems  Constitutional: Negative for fatigue.  Respiratory: Negative for shortness of breath.   Neurological: Negative for weakness.    Physical Exam Vitals and nursing note reviewed.  Constitutional:      General: She is not in acute distress. Cardiovascular:     Rate and Rhythm: Normal rate.  Pulmonary:     Effort: Pulmonary effort is normal. No tachypnea, accessory muscle usage or respiratory distress.  Abdominal:     General: Abdomen is flat.  Neurological:     Mental Status: She is alert and oriented to person, place, and time.     Vital Signs: BP (!) 131/53 (BP Location: Right Arm)   Pulse 72   Temp 98.2 F (36.8 C) (Oral)   Resp 16   Ht '5\' 2"'  (1.575 m)   Wt 50.7 kg   LMP  (LMP Unknown)   SpO2 100%   BMI 20.44 kg/m  Pain Scale: 0-10 POSS *See Group Information*: 1-Acceptable,Awake and alert Pain Score: 0-No pain   SpO2: SpO2: 100 % O2 Device:SpO2: 100 % O2 Flow Rate: .   IO: Intake/output summary: No intake or output data in the 24 hours ending 07/23/19 0855  LBM: Last BM Date: 07/22/19 Baseline Weight: Weight: 43.1 kg Most recent weight: Weight: 50.7 kg     Palliative Assessment/Data:     Time In/Out: 0915-1030, 1130-1145 Time Total: 90 min Greater than 50%  of this time was spent counseling and coordinating care related to the above assessment and plan.  Signed by: Vinie Sill, NP Palliative Medicine Team Pager # [phone] (M-F 8a-5p) Team Phone # [phone] (Nights/Weekends)

## 2019-07-24 DIAGNOSIS — I10 Essential (primary) hypertension: Secondary | ICD-10-CM

## 2019-07-24 LAB — PHOSPHORUS: Phosphorus: 2.1 mg/dL — ABNORMAL LOW (ref 2.5–4.6)

## 2019-07-24 LAB — CBC WITH DIFFERENTIAL/PLATELET
Abs Immature Granulocytes: 0.06 10*3/uL (ref 0.00–0.07)
Basophils Absolute: 0.1 10*3/uL (ref 0.0–0.1)
Basophils Relative: 1 %
Eosinophils Absolute: 0 10*3/uL (ref 0.0–0.5)
Eosinophils Relative: 0 %
HCT: 27.9 % — ABNORMAL LOW (ref 36.0–46.0)
Hemoglobin: 8.7 g/dL — ABNORMAL LOW (ref 12.0–15.0)
Immature Granulocytes: 1 %
Lymphocytes Relative: 11 %
Lymphs Abs: 0.8 10*3/uL (ref 0.7–4.0)
MCH: 20.3 pg — ABNORMAL LOW (ref 26.0–34.0)
MCHC: 31.2 g/dL (ref 30.0–36.0)
MCV: 65 fL — ABNORMAL LOW (ref 80.0–100.0)
Monocytes Absolute: 0.7 10*3/uL (ref 0.1–1.0)
Monocytes Relative: 9 %
Neutro Abs: 5.8 10*3/uL (ref 1.7–7.7)
Neutrophils Relative %: 78 %
Platelets: 115 10*3/uL — ABNORMAL LOW (ref 150–400)
RBC: 4.29 MIL/uL (ref 3.87–5.11)
RDW: 19.7 % — ABNORMAL HIGH (ref 11.5–15.5)
WBC: 7.4 10*3/uL (ref 4.0–10.5)
nRBC: 0.4 % — ABNORMAL HIGH (ref 0.0–0.2)

## 2019-07-24 LAB — COMPREHENSIVE METABOLIC PANEL
ALT: 37 U/L (ref 0–44)
AST: 47 U/L — ABNORMAL HIGH (ref 15–41)
Albumin: 2.6 g/dL — ABNORMAL LOW (ref 3.5–5.0)
Alkaline Phosphatase: 47 U/L (ref 38–126)
Anion gap: 8 (ref 5–15)
BUN: 8 mg/dL (ref 6–20)
CO2: 30 mmol/L (ref 22–32)
Calcium: 8.2 mg/dL — ABNORMAL LOW (ref 8.9–10.3)
Chloride: 100 mmol/L (ref 98–111)
Creatinine, Ser: 1.7 mg/dL — ABNORMAL HIGH (ref 0.44–1.00)
GFR calc Af Amer: 38 mL/min — ABNORMAL LOW (ref >60)
GFR calc non Af Amer: 33 mL/min — ABNORMAL LOW (ref >60)
Glucose, Bld: 67 mg/dL — ABNORMAL LOW (ref 70–99)
Potassium: 3.9 mmol/L (ref 3.5–5.1)
Sodium: 138 mmol/L (ref 135–145)
Total Bilirubin: 0.8 mg/dL (ref 0.3–1.2)
Total Protein: 5.2 g/dL — ABNORMAL LOW (ref 6.5–8.1)

## 2019-07-24 LAB — GLUCOSE, CAPILLARY
Glucose-Capillary: 51 mg/dL — ABNORMAL LOW (ref 70–99)
Glucose-Capillary: 52 mg/dL — ABNORMAL LOW (ref 70–99)
Glucose-Capillary: 178 mg/dL — ABNORMAL HIGH (ref 70–99)
Glucose-Capillary: 80 mg/dL (ref 70–99)
Glucose-Capillary: 129 mg/dL — ABNORMAL HIGH (ref 70–99)
Glucose-Capillary: 148 mg/dL — ABNORMAL HIGH (ref 70–99)

## 2019-07-24 LAB — MAGNESIUM: Magnesium: 2 mg/dL (ref 1.7–2.4)

## 2019-07-24 NOTE — Progress Notes (Signed)
PROGRESS NOTE        PATIENT DETAILS Name: Crystal Jacobs Age: 59 y.o. Sex: female Date of Birth: Jun 21, 1960 Admit Date: 07/13/2019 Admitting Physician Lenore Cordia, MD [id number], No Pcp Per  Brief Narrative: Patient is a 59 y.o. female with tree of IDDM, HTN, CKD stage IV, claims to have unilateral kidney-from [place]-here visiting family- presented to the hospital with weakness-she was found to have a sodium of 1o6, creatinine of 6.5-subsequently admitted to the hospitalist service.  Hospital course complicated by development of acute encephalopathy, poor appetite, asterixis-in spite of correction of hyponatremia-subsequently thought to be secondary to uremia-subsequently started on ESRD.  Significant events: 6/19>> admit to [facility] for generalized weakness found to have severe hyponatremia-sodium of 106 6/24>> started on HD  Significant studies: 6/19>> chest x-ray: No active disease 6/23>> CT head: Chronic small vessel disease without acute intracranial abnormality. 6/23>> x-ray abdomen: No acute abnormality noted.  Antimicrobial therapy: Rocephin: 6/25>> 6/27  Microbiology data: 6/24 >>urine culture:<10,000 colonies/mL insignificant growth 6/23>> blood culture: Negative  Procedures : None  Consults: Nephrology  DVT Prophylaxis : Place and maintain sequential compression device Start: 07/17/19 2017  Subjective: Lying comfortably in bed-no chest pain or shortness of breath.  Assessment/Plan: Hyponatremia: Sodium of 106 on admission-thought to be secondary to hypovolemia in the setting of nausea/vomiting diarrhea and orthostatic symptoms.  Treated with IV fluids-hyponatremia has resolved.  CKD stage IV-now with progression to ESRD: Has known CKD stage IV-and had a fistula placed earlier this year in [place]-in spite of correction of hyponatremia-patient apparently continued to have confusion, poor appetite and asterixis-subsequently started on HD  with marked improvement.  Nephrology following and directing care.  Acute metabolic encephalopathy: Likely secondary to [facility] post HD.  Chronic hepatitis B: Evaluated by ID-family prefers that treatment of chronic hepatitis B deferred when she returns to [place].  IDDM (A1c 6.1 07/14/2019): Hypoglycemic episode earlier this morning-stop Lantus and scheduled NovoLog-continue SSI-follow and adjust.  Recent Labs    07/24/19 0842 07/24/19 0941 07/24/19 1143  GLUCAP 52* 178* 80    HTN: BP stable-continue amlodipine and metoprolol.  Thrombocytopenia: Improved-HIT panel negative.  Anemia: Secondary to CKD-hemoglobin trending down-nephrology following and managing care.  Early asymptomatic-however if hemoglobin decreases to less than 7- will require transfusion.  Disposition issues: Difficult situation-patient wants to go back to [place]-and discuss further HD with her primary nephrologist-however logistical issues/family issues etc. are a barrier for discharge.  Social work following-family discussion with renal navigator to see if outpatient hemodialysis can be arranged before they fly out to [place].  Nutrition Problem:  Nutrition Problem: Inadequate oral intake Etiology: poor appetite Signs/Symptoms: per patient/family report Interventions: MVI, Magic cup, Nepro shake   Diet: Diet Order            Diet renal with fluid restriction Fluid restriction: 2000 mL Fluid; Room service appropriate? Yes; Fluid consistency: Thin  Diet effective now                  Code Status: Full code  Family Communication: Daughter at bedside  Disposition Plan: Home vs Home with Home health vs SNF when ready for discharge Status is: Inpatient  Remains inpatient appropriate because:Inpatient level of care appropriate due to severity of illness  Dispo: The patient is from: Home              Anticipated d/c  is to: Home              Anticipated d/c date is: 1 day              Patient  currently is medically stable to d/c.   Barriers to Discharge: Needs outpatient hemodialysis arrangement  Antimicrobial agents: Anti-infectives (From admission, onward)   Start     Dose/Rate Route Frequency Ordered Stop   07/18/19 1000  cefTRIAXone (ROCEPHIN) 1 g in sodium chloride 0.9 % 100 mL IVPB  Status:  Discontinued        1 g 200 mL/hr over 30 Minutes Intravenous Every 24 hours 07/18/19 0905 07/20/19 1230       Time spent: 25- minutes-Greater than 50% of this time was spent in counseling, explanation of diagnosis, planning of further management, and coordination of care.  MEDICATIONS: Scheduled Meds: . amLODipine  10 mg Oral Daily  . aspirin EC  81 mg Oral Daily  . Chlorhexidine Gluconate Cloth  6 each Topical Q0600  . cholecalciferol  1,000 Units Oral Q1400  . clopidogrel  75 mg Oral Daily  . [START ON 07/25/2019] darbepoetin (ARANESP) injection - NON-DIALYSIS  100 mcg Subcutaneous Q Thu-1800  . febuxostat  40 mg Oral Q1400  . feeding supplement (NEPRO CARB STEADY)  237 mL Oral BID BM  . insulin aspart  0-6 Units Subcutaneous TID WC  . insulin aspart  2 Units Subcutaneous TID WC  . insulin detemir  3 Units Subcutaneous BID  . metoprolol tartrate  12.5 mg Oral BID  . multivitamin  1 tablet Oral QHS  . sodium chloride flush  3 mL Intravenous Q12H  . sodium chloride flush  3 mL Intravenous Q12H   Continuous Infusions: . sodium chloride 250 mL (07/19/19 1721)  . sodium chloride    . sodium chloride     PRN Meds:.sodium chloride, sodium chloride, sodium chloride, acetaminophen **OR** acetaminophen, haloperidol lactate, hydrALAZINE, lidocaine (PF), lidocaine-prilocaine, LORazepam, ondansetron **OR** ondansetron (ZOFRAN) IV, pentafluoroprop-tetrafluoroeth, senna, sodium chloride flush   PHYSICAL EXAM: Vital signs: Vitals:   07/24/19 0400 07/24/19 0430 07/24/19 0500 07/24/19 0507  BP: 129/75 134/64 130/62 (!) 141/66  Pulse: 75 78 78 79  Resp:    16  Temp:    98 F  (36.7 C)  TempSrc:    Oral  SpO2:    97%  Weight:    47.1 kg  Height:       Filed Weights   07/23/19 0515 07/24/19 0147 07/24/19 0507  Weight: 50.7 kg 47.1 kg 47.1 kg   Body mass index is 18.99 kg/m.   Gen Exam:Alert awake-not in any distress HEENT:atraumatic, normocephalic Chest: B/L clear to auscultation anteriorly CVS:S1S2 regular Abdomen:soft non tender, non distended Extremities:no edema Neurology: Non focal Skin: no rash  I have personally reviewed following labs and imaging studies  LABORATORY DATA: CBC: Recent Labs  Lab 07/20/19 0408 07/21/19 0441 07/22/19 0928 07/23/19 0707 07/24/19 0820  WBC 6.3 6.4 8.6 7.4 7.4  NEUTROABS 4.8 4.8 7.1 5.8 5.8  HGB 8.0* 8.4* 9.0* 7.5* 8.7*  HCT 25.5* 26.6* 28.9* 23.7* 27.9*  MCV 63.0* 63.9* 64.1* 64.6* 65.0*  PLT 73* 77* 101* 92* 115*    Basic Metabolic Panel: Recent Labs  Lab 07/20/19 0408 07/20/19 0408 07/20/19 0938 07/21/19 0441 07/22/19 0928 07/23/19 0707 07/24/19 0820  NA 134*  --   --  135 133* 133* 138  K 3.1*  --   --  3.9 3.8 4.4 3.9  CL 99  --   --  99 98 99 100  CO2 26  --   --  30 28 24 30   GLUCOSE 98  --   --  135* 127* 83 67*  BUN 9  --   --  5* 15 19 8   CREATININE 2.56*  --   --  1.73* 2.68* 3.26* 1.70*  CALCIUM 8.3*  --   --  8.7* 8.9 8.3* 8.2*  MG 1.8   < > 1.7 2.3 2.4 2.3 2.0  PHOS 2.4*  --   --  1.7* 3.8 4.2 2.1*   < > = values in this interval not displayed.    GFR: Estimated Creatinine Clearance: 27.5 mL/min (A) (by C-G formula based on SCr of 1.7 mg/dL (H)).  Liver Function Tests: Recent Labs  Lab 07/19/19 0401 07/20/19 0408 07/21/19 0441 07/22/19 0928 07/24/19 0820  AST 24 26 25 30  47*  ALT 21 21 22 25  37  ALKPHOS 45 47 43 60 47  BILITOT 0.7 0.7 0.8 1.1 0.8  PROT 5.1* 4.8* 5.0* 5.4* 5.2*  ALBUMIN 2.7* 2.4* 2.5* 2.8* 2.6*   No results for input(s): LIPASE, AMYLASE in the last 168 hours. Recent Labs  Lab 07/17/19 1731  AMMONIA 22    Coagulation Profile: No results  for input(s): INR, PROTIME in the last 168 hours.  Cardiac Enzymes: No results for input(s): CKTOTAL, CKMB, CKMBINDEX, TROPONINI in the last 168 hours.  BNP (last 3 results) No results for input(s): PROBNP in the last 8760 hours.  Lipid Profile: No results for input(s): CHOL, HDL, LDLCALC, TRIG, CHOLHDL, LDLDIRECT in the last 72 hours.  Thyroid Function Tests: No results for input(s): TSH, T4TOTAL, FREET4, T3FREE, THYROIDAB in the last 72 hours.  Anemia Panel: No results for input(s): VITAMINB12, FOLATE, FERRITIN, TIBC, IRON, RETICCTPCT in the last 72 hours.  Urine analysis:    Component Value Date/Time   COLORURINE YELLOW 07/18/2019 1819   APPEARANCEUR CLEAR 07/18/2019 1819   LABSPEC 1.008 07/18/2019 1819   PHURINE 7.0 07/18/2019 1819   GLUCOSEU 150 (A) 07/18/2019 1819   HGBUR NEGATIVE 07/18/2019 1819   BILIRUBINUR NEGATIVE 07/18/2019 1819   KETONESUR NEGATIVE 07/18/2019 1819   PROTEINUR 100 (A) 07/18/2019 1819   NITRITE NEGATIVE 07/18/2019 1819   LEUKOCYTESUR NEGATIVE 07/18/2019 1819    Sepsis Labs: Lactic Acid, Venous No results found for: LATICACIDVEN  MICROBIOLOGY: Recent Results (from the past 240 hour(s))  Culture, blood (routine x 2)     Status: None   Collection Time: 07/17/19  5:31 PM   Specimen: BLOOD  Result Value Ref Range Status   Specimen Description BLOOD RIGHT ANTECUBITAL  Final   Special Requests   Final    BOTTLES DRAWN AEROBIC ONLY Blood Culture adequate volume   Culture   Final    NO GROWTH 5 DAYS Performed at [facility], [place] [address] [place], [place] [address]    Report Status 07/22/2019 FINAL  Final  Culture, blood (routine x 2)     Status: None   Collection Time: 07/17/19  5:38 PM   Specimen: BLOOD LEFT WRIST  Result Value Ref Range Status   Specimen Description BLOOD LEFT WRIST  Final   Special Requests   Final    BOTTLES DRAWN AEROBIC ONLY Blood Culture adequate volume   Culture   Final    NO GROWTH 5 DAYS Performed at  [facility], [place] [address], [place], [place] [address]    Report Status 07/22/2019 FINAL  Final  Culture, Urine  Status: Abnormal   Collection Time: 07/18/19  6:20 PM   Specimen: Urine, Random  Result Value Ref Range Status   Specimen Description URINE, RANDOM  Final   Special Requests NONE  Final   Culture (A)  Final    <10,000 COLONIES/mL INSIGNIFICANT GROWTH Performed at [facility], [address], [place], [place] [address]    Report Status 07/20/2019 FINAL  Final    RADIOLOGY STUDIES/RESULTS: No results found.   LOS: 10 days   Oren Binet, MD  [facility]    To contact the attending provider between 7A-7P or the covering provider during after hours 7P-7A, please log into the web site [email or web address] and access using universal  password for that web site. If you do not have the password, please call the hospital operator.  07/24/2019, 1:51 PM

## 2019-07-24 NOTE — Progress Notes (Addendum)
Inpatient Diabetes Program Recommendations  AACE/ADA: New Consensus Statement on Inpatient Glycemic Control (2015)  Target Ranges:  Prepandial:   less than 140 mg/dL      Peak postprandial:   less than 180 mg/dL (1-2 hours)      Critically ill patients:  140 - 180 mg/dL   Lab Results  Component Value Date   GLUCAP 80 07/24/2019   HGBA1C 6.2 (H) 07/14/2019    Review of Glycemic Control Results for Crystal Jacobs, Crystal Jacobs (MRN [id number]) as of 07/24/2019 13:17  Ref. Range 07/24/2019 07:48 07/24/2019 08:42 07/24/2019 09:41 07/24/2019 11:43  Glucose-Capillary Latest Ref Range: 70 - 99 mg/dL 51 (L) 52 (L) 178 (H) 80   Diabetes history: Type 2 DM Outpatient Diabetes medications: Levemir 4 units BID, Novolog 2-6 units TID Current orders for Inpatient glycemic control: Levemir 3 units BID, Novolog 2 units TID, Novolog 0-6 units TID  Inpatient Diabetes Program Recommendations:    Noted hypoglycemic event this AM of 51 mg/dL. Consider discontinuing Levemir.   Thanks, Bronson Curb, MSN, RNC-OB Diabetes Coordinator [phone] (8a-5p)

## 2019-07-24 NOTE — Progress Notes (Addendum)
Palliative:  I met today at bedside with patient and daughter. Discussed case previously with Dr. Sloan Leiter and renal navigator. Daughter reports that family are still discussing if outpatient dialysis is a realistic option (financial/logistical barriers). Ultimate goal decided was to continue with dialysis as medically recommended with plan to return to [place] as soon as possible. They ask many questions about financial responsibilities of dialysis options which is not information that I am familiar with. I point them towards renal navigator for further information about dialysis options. They confirm goal of care to continue dialysis and to return to [place] as soon as can be arranged.   No further questions/concerns that palliative medicine team can answer. Goals of care clear but logistics and financial concerns are not something that I am able to assist with. I will continue to follow chart for any further palliative needs but will not follow with patient as to not muddy the waters with another provider involved in already complicated situation. Please page me at number below or call [phone] for further palliative needs.   Exam: Alert, oriented. No complaints. Breathing regular unlabored. Walking with therapy very well with walker and supervision. Abd flat.   Plan: - Continue dialysis (where she will receive said dialysis is the big question) until able to return to [place] and develop plan with her nephrologist.  - Discussed with Dr. Sloan Leiter and renal navigator, [place].   [place], NP Palliative Medicine Team Pager [phone] (Please see [email or web address] for schedule) Team Phone [phone]    Greater than 50%  of this time was spent counseling and coordinating care related to the above assessment and plan

## 2019-07-24 NOTE — Progress Notes (Signed)
Notified by RN-earlier this evening that family-had additional questions- met with them again at bedside (3rd visit today!)-they still were asking the same questions that we had discussed in the morning and this afternoon. I spoke with daughter at bedside, son and patients brother over the speaker phone-again explained importance of getting a outpatient HD slot before discharge.  Probation officer also present.   I discussed with nursing subsequently-patient will be seen once a day and as needed additional time for medical issues, however with issues pertaining to already discussed issues (numerous times) regarding discharge, costs of inpatient HD etc-this will be deferred to SW/Case management. Unit RN leadership will reach out to financial counseller tomorrow morning.

## 2019-07-24 NOTE — Progress Notes (Signed)
Renal Navigator continues to follow to assist with safe discharge plan and remains in communication with Attending and Nephrologist.  Navigator met with patient and her daughter this morning. Navigator understands that patient had inpt HD overnight. Patient's daughter reports that it is unlikely that she or her brother will be able to accompany their mother on the flight back to [place] as soon as this weekend because of needing to clear the time off with their employers and therefore, it is more likely that her mother will not travel back to [place] for 2-3 weeks. She states she would like for her mother to discharge today and stay with her in [place] for about a week to rest, since she has just been in the hospital, and then she plans to fly to [place], where she will stay until she and or her brother can fly to [place] with her. Navigator informed daughter that, unfortunately: 1. Patient cannot safely discharge today without plans for HD in place, 2: Navigator is not able to arrange for OP HD in [place] as patient is uninsured and in need of an isolation seat due to HepB positive status. Navigator is only capable of arranging for OP HD seat in [place]. If they elect for this option, patient really needs to remain in [place] until the flight back to [place]. In this case, the cost per treatment is $700.00 and the total number of treatments needs to be paid up front. She will need to go three times per week. This would be a MWF seat at [facility], though referral was canceled yesterday when family stated this would not be an option for them, however, Navigator understands that this is an overwhelming situation with many moving parts and will re-refer if they are in need of OP HD here and agree to take patient until returning to [place]. Arrangements will take a few days as the Museum/gallery curator for [employer] will need to speak directly with the family to collect the money prior to her starting OP HD at the  clinic.  Patient's daughter requests that Renal Navigator follow up in a few hours.   Alphonzo Cruise, [place] Renal Navigator [phone]

## 2019-07-24 NOTE — Plan of Care (Signed)
Spoke with patient, her son, and her daughter in the room along with Dr. Sloan Leiter and Terri Piedra, SW.  The patient states that she does not want to have HD again until Monday, 7/5.  They are frustrated that she is not able to leave but we discussed that she does not have an outpatient dialysis unit set up yet.  Will assess her labs daily for emergencies.  We have discussed that dialysis is three times a week and that missing or shortening treatments does increase the risk of death; patient and her family voice understanding.  Thankfully she does have some residual renal function.  They have given a departure date to HD SW (of 7/19)- she will now start the search for outpatient unit and obtain financial clearance.   It is clear that the patient has a supportive family and they are worried about costs is an unanticipated situation.  However, we have discussed that it is important to not plan for emergencies such as PRN dialysis given that she has progressed to ESRD.  Claudia Desanctis, MD 4:23 PM 07/24/2019

## 2019-07-24 NOTE — Progress Notes (Signed)
Physical Therapy Treatment Patient Details Name: Crystal Jacobs MRN: [id number] DOB: 02-21-1960 Today's Date: 07/24/2019    History of Present Illness Pt adm with weakness and found to be hyponatremic and with orthostatic hypotension. PMH - DM, CKD, htn, nephrectomy. 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    PT Comments    Pt in bathroom waiting to for assistance with pericare.  Pt required assistance for clean up and change of gowns.  She voiced she feels stronger and no longer dizzy.  Pt required close supervision for safety with use of rollator.  She plans to return home with support from her daughter.  Daughter voiced they will make sure she has appropriate equipment.     Follow Up Recommendations  Home health PT     Equipment Recommendations  Other (comment) (4 wheeled Rolling walker with seat ( rollator))    Recommendations for Other Services       Precautions / Restrictions Precautions Precautions: Fall Precaution Comments: watch orthostatics Restrictions Weight Bearing Restrictions: No Other Position/Activity Restrictions: heavily accented English and she does not understand all phrases, speak slowly    Mobility  Bed Mobility               General bed mobility comments: Pt standing in bathroom with nurse tech present.  Transfers Overall transfer level: Needs assistance Equipment used: 4-wheeled walker Transfers: Sit to/from Stand Sit to Stand: Supervision         General transfer comment: Supervision for safety to return to seated surface.  Pt did not lock rollator brakes and required education on safety.  Ambulation/Gait Ambulation/Gait assistance: Supervision Gait Distance (Feet): 220 Feet Assistive device: 4-wheeled walker Gait Pattern/deviations: Step-through pattern;Decreased stride length Gait velocity: decr   General Gait Details: Cues for upper trunk control and pacing as patient tends to move quickly and  requires cues for pacing to maintain safety.   Stairs             Wheelchair Mobility    Modified Rankin (Stroke Patients Only)       Balance Overall balance assessment: Needs assistance   Sitting balance-Leahy Scale: Good       Standing balance-Leahy Scale: Poor                              Cognition Arousal/Alertness: Awake/alert Behavior During Therapy: Impulsive Overall Cognitive Status: Within Functional Limits for tasks assessed Area of Impairment: Safety/judgement                         Safety/Judgement: Decreased awareness of safety     General Comments: Pt aware that she needs assistance to mobilize.      Exercises      General Comments        Pertinent Vitals/Pain Pain Assessment: No/denies pain Faces Pain Scale: No hurt    Home Living                      Prior Function            PT Goals (current goals can now be found in the care plan section) Acute Rehab PT Goals Patient Stated Goal: not stated Potential to Achieve Goals: Good Progress towards PT goals: Progressing toward goals    Frequency    Min 3X/week      PT Plan Current plan remains appropriate  Co-evaluation              AM-PAC PT "6 Clicks" Mobility   Outcome Measure  Help needed turning from your back to your side while in a flat bed without using bedrails?: A Little Help needed moving from lying on your back to sitting on the side of a flat bed without using bedrails?: A Little Help needed moving to and from a bed to a chair (including a wheelchair)?: A Little Help needed standing up from a chair using your arms (e.g., wheelchair or bedside chair)?: A Little Help needed to walk in hospital room?: A Little Help needed climbing 3-5 steps with a railing? : A Little 6 Click Score: 18    End of Session Equipment Utilized During Treatment: Gait belt Activity Tolerance: Patient tolerated treatment well Patient left: in  bed;with call bell/phone within reach;with family/visitor present Nurse Communication: Mobility status PT Visit Diagnosis: Muscle weakness (generalized) (M62.81);Other abnormalities of gait and mobility (R26.89)     Time: 1251-1306 PT Time Calculation (min) (ACUTE ONLY): 15 min  Charges:  $Gait Training: 8-22 mins                     Erasmo Leventhal , PTA Acute Rehabilitation Services Pager [phone] Office [phone]     Crystal Jacobs Eli Hose 07/24/2019, 1:24 PM

## 2019-07-24 NOTE — Progress Notes (Signed)
[facility] Progress Note    Assessment/ Plan:   1. 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  Creat 6.5 > 5.5-- 6.2 here w/ IVF's.  Discussions with pt and son.  HD #1 6/24, HD #2 6/25 HD #3 6/26.  Improvement in symptoms with HD.  Nephrology has had multiple long discussions with pt, son, dtr re: ESRD.  Dialysis is life- sustaining at this point and stopping or even skipping rx confers greater risk of MI, CVA, uremic pericarditis, sleepiness, lethargy, buildup of minerals and uremic toxins, uremic coma, and death.   1. No acute indication for HD today - plan for HD on 7/1 2. Appreciate assistance of SW.  Patient was given an estimate for the cost of outpatient HD and had elected to return home to [place] to get HD there.  Depending on travel plans for family to accompany here she may end up leaving this weekend or in 2-3 weeks - SW is continuing to follow  3. Strict ins/outs are ordered  2. Acute encephalopathy: Improving. CT head negative, ABG OK, ammonia OK, AXR without constipation, blood cultures neg.  UA on admission with bacteruria, empirically rx with Rocephin. Likely uremia.   3. Hyponatremia - Na+ 106 on admit 6/19 w/ orthostatic symptoms and recent N/V, diarrhea. improved   4. Twitching: multifactorial but likely mineral imbalances.    5. Chronic Hep B: S Ag positive, SAB, CAB, Hep B e, HBV PCR elevated - treatment per primary team   6. Hypovolemia -resolved  7. HyperK- resolved  8. IDDM - per primary team  9. Anemia CKD - Hemoglobin has trended down.  Continue ESA - increase next dose to 100 mcg aranesp from 60 mcg for 7/1. Iron is replete   9.  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 Dispo: Appreciate assistance of SW.  Patient was given an estimate for the cost of outpatient HD and had elected to return home to [place] to get HD there.  Depending on travel plans for family to accompany here she may end up leaving this  weekend or in 2-3 weeks - SW is continuing to follow   Subjective:    Yesterday after initially refusing HD she did agree to treatment.  Because she has hep B and requires accomodation for same she ended up dialyzing overnight.  She and family had elected to have her fly back to [place] this coming weekend after they were given the costs for outpatient HD here.  Her daughter states this AM that her mother may leave this weekend or in 2-3 weeks -depending on clearance from her brother's employer as her son or daughter will accompany her back to [place].  So she asks again about the potential of outpatient HD in the interim should her mother end up staying 2-3 weeks  Review of systems:   Denies current n/v Denies shortness of breath or chest pain    Objective:   BP (!) 141/66 (BP Location: Right Arm)   Pulse 79   Temp 98 F (36.7 C) (Oral)   Resp 16   Ht 5\' 2"  (1.575 m)   Wt 47.1 kg   LMP  (LMP Unknown)   SpO2 97%   BMI 18.99 kg/m   Intake/Output Summary (Last 24 hours) at 07/24/2019 0848 Last data filed at 07/24/2019 0507 Gross per 24 hour  Intake 240 ml  Output 450 ml  Net -210 ml   Weight change: -3.6 kg  Physical Exam:   Gen: adult female in bed in NAD CVS: RRR Resp: clear and unlabored  Abd: soft nt nd Ext: no LE edema appreciated ACCESS: LUE AVF + T/B NEURO: AAOx 3 ; provides hx and follows commands Psych - normal mood and affect   Imaging: No results found.  Labs: BMET Recent Labs  Lab 07/17/19 2232 07/18/19 0435 07/19/19 0401 07/20/19 0408 07/21/19 0441 07/22/19 0928 07/23/19 0707  NA 130* 128* 135 134* 135 133* 133*  K 5.0 4.6 3.5 3.1* 3.9 3.8 4.4  CL 97* 98 99 99 99 98 99  CO2 22 21* 25 26 30 28 24   GLUCOSE 206* 143* 77 98 135* 127* 83  BUN 37* 37* 18 9 5* 15 19  CREATININE 6.06* 6.02* 3.71* 2.56* 1.73* 2.68* 3.26*  CALCIUM 9.6 8.9 8.8* 8.3* 8.7* 8.9 8.3*  PHOS 3.8 3.9 3.5 2.4* 1.7* 3.8 4.2   CBC Recent Labs  Lab 07/20/19 0408 07/21/19 0441  07/22/19 0928 07/23/19 0707  WBC 6.3 6.4 8.6 7.4  NEUTROABS 4.8 4.8 7.1 5.8  HGB 8.0* 8.4* 9.0* 7.5*  HCT 25.5* 26.6* 28.9* 23.7*  MCV 63.0* 63.9* 64.1* 64.6*  PLT 73* 77* 101* 92*    Medications:    . amLODipine  10 mg Oral Daily  . aspirin EC  81 mg Oral Daily  . Chlorhexidine Gluconate Cloth  6 each Topical Q0600  . Chlorhexidine Gluconate Cloth  6 each Topical Q0600  . cholecalciferol  1,000 Units Oral Q1400  . clopidogrel  75 mg Oral Daily  . [START ON 07/25/2019] darbepoetin (ARANESP) injection - NON-DIALYSIS  100 mcg Subcutaneous Q Thu-1800  . febuxostat  40 mg Oral Q1400  . feeding supplement (NEPRO CARB STEADY)  237 mL Oral BID BM  . insulin aspart  0-6 Units Subcutaneous TID WC  . insulin aspart  2 Units Subcutaneous TID WC  . insulin detemir  3 Units Subcutaneous BID  . metoprolol tartrate  12.5 mg Oral BID  . multivitamin  1 tablet Oral QHS  . sodium chloride flush  3 mL Intravenous Q12H  . sodium chloride flush  3 mL Intravenous Q12H      Claudia Desanctis, MD 07/24/2019, 07/24/2019

## 2019-07-24 NOTE — Progress Notes (Signed)
Renal Navigator returned to room to follow up on conversation from this morning and daughter asked again about the cost of outpatient HD. Navigator explained that it has not changed and that to move forward with OP HD, Navigator needs to know an end date/return date to [place] and needs to be sure that this is the family/patient's plan. Patient then said, "I want to go home now and think about this. I will let you know on Monday." In attempting to explain that she cannot leave the hospital without a follow up plan for OP HD, patient and daughter again stated that they just need time to think and want to be discharged now. Navigator informed them that they will need to discuss this with Attending and asked that Dr. Sloan Leiter meet with them. We held a multidisciplinary team meeting in the room with myself, Dr. Rayburn Go, Dr. Bartholomew Boards, patient and daughter, with patient's son on speaker phone. All aspects of care that we all have discussed over and over were again reviewed: need for ongoing HD three times per week, risk of death without regular HD, ability to sign out AMA if this is their wish.  At this time, patient and her daughter report that patient has a return plane ticket for 08/12/19 and that they would like Renal Navigator to arrange OP HD from hospital discharge to her date of flight.  Navigator called [employer]/gallery curator for out of pocket payments and provided her with patient's name, contact person, which daughter states will be her, and flight date. She reports that she will call patient's daughter in the morning and call Renal Navigator back. We will move forward with securing patient's seat at [facility] clinic once payments have been arranged.  By the end of the conversation, patient's daughter again asked when discharge would be. Navigator again informed her that in order for a safe discharge plan, OP HD must be arranged before she can be discharged. They were told  that if patient chooses to leave AMA, the OP HD arrangements will not continue and would start over if she arrives back to the hospital. Navigator added that getting patient accepted to an OP HD clinic if she chooses to leave AMA from this admission could prove to be much more difficult than things already are. They continue to state that they want her OP HD to be set up for Monday (July 5), and Navigator continues to state that Monday, 7/5 is a good goal and not a guarantee.   Alphonzo Cruise, [place] Renal Navigator [phone]

## 2019-07-24 NOTE — Progress Notes (Signed)
Hypoglycemic Event  CBG: 52  Treatment: Sprite  Symptoms: Lethargy  Follow-up CBG: CNOB:0962 CBG Result:178  Possible Reasons for Event: Poor po intake  Comments/MD notified:Yes,    Royston Cowper Eduardo Wurth RN

## 2019-07-24 NOTE — TOC Progression Note (Signed)
Transition of Care [facility]) - Progression Note    Patient Details  Name: Crystal Jacobs MRN: [id number] Date of Birth: [birth year]  [facility] CM/SW Contact  Curlene Labrum, RN Phone Number: 07/24/2019, 11:50 AM  Clinical Narrative:    Case management met with the daughter, Cephus Slater, in the room to discuss transitions of care.  The daughter verbalizes that she is trying to weigh out the cost of inpatient stay at the hospital and outpatient HD prior to flying back to [place] to receive covered HD treatment for the patient.  The daughter expresses that she may not be able to coordinate a flight for her mother this coming weekend due to her and her brother's employment.  The daughter is working on arrangements and plans for a flight home to [place] in the next 2-3 weeks.  I spoke with Terri Piedra, CSW renal navigator and notified her that the patient may not fly back to [place] for another 2-3 weeks.  Will continue to follow for transition to care needs.   Expected Discharge Plan: [place] Barriers to Discharge: Continued Medical Work up  Expected Discharge Plan and Services Expected Discharge Plan: [place] In-house Referral: PCP / Psychologist, educational, Financial Counselor Discharge Planning Services: CM Consult Post Acute Care Choice: Durable Medical Equipment (Daughter is going to pick up a Conservation officer, nature with [employer] wheels from [place] today.) Living arrangements for the past 2 months: Single Family Home                 DME Arranged: Walker rolling (Rw to be purchased by daughter through [employer] - patient with no SS# to receive charity dme) DME Agency:  Engineer, building services)       HH Arranged: PT HH Agency: [facility] Date [place]: 07/16/19 Time [place]: 1419 Representative spoke with at [place]: Waiting for confirmation from [place], [place] for PT   Social Determinants of Health ([place]) Interventions     Readmission Risk Interventions Readmission Risk Prevention Plan 07/16/2019  Transportation Screening Complete  PCP or Specialist Appt within 5-7 Days Complete  Home Care Screening Complete  Medication Review (RN CM) Complete

## 2019-07-25 LAB — RENAL FUNCTION PANEL
Albumin: 2.7 g/dL — ABNORMAL LOW (ref 3.5–5.0)
Anion gap: 6 (ref 5–15)
BUN: 10 mg/dL (ref 6–20)
CO2: 29 mmol/L (ref 22–32)
Calcium: 8 mg/dL — ABNORMAL LOW (ref 8.9–10.3)
Chloride: 98 mmol/L (ref 98–111)
Creatinine, Ser: 2.36 mg/dL — ABNORMAL HIGH (ref 0.44–1.00)
GFR calc Af Amer: 26 mL/min — ABNORMAL LOW (ref >60)
GFR calc non Af Amer: 22 mL/min — ABNORMAL LOW (ref >60)
Glucose, Bld: 106 mg/dL — ABNORMAL HIGH (ref 70–99)
Phosphorus: 2.7 mg/dL (ref 2.5–4.6)
Potassium: 4 mmol/L (ref 3.5–5.1)
Sodium: 133 mmol/L — ABNORMAL LOW (ref 135–145)

## 2019-07-25 LAB — GLUCOSE, CAPILLARY
Glucose-Capillary: 48 mg/dL — ABNORMAL LOW (ref 70–99)
Glucose-Capillary: 104 mg/dL — ABNORMAL HIGH (ref 70–99)
Glucose-Capillary: 73 mg/dL (ref 70–99)
Glucose-Capillary: 182 mg/dL — ABNORMAL HIGH (ref 70–99)

## 2019-07-25 MED ORDER — NEPRO/CARBSTEADY PO LIQD: 237.0000 mL | Freq: Two times a day (BID) | ORAL | 0 refills | Status: AC

## 2019-07-25 MED ORDER — METOPROLOL TARTRATE 25 MG PO TABS: 12.5000 mg | ORAL_TABLET | Freq: Two times a day (BID) | ORAL | 0 refills | Status: AC

## 2019-07-25 MED ORDER — LOPERAMIDE HCL 2 MG PO CAPS: 2.0000 mg | ORAL_CAPSULE | ORAL | 0 refills | Status: AC | PRN

## 2019-07-25 MED ORDER — CHLORHEXIDINE GLUCONATE CLOTH 2 % EX PADS: 6.0000 | MEDICATED_PAD | Freq: Every day | CUTANEOUS | Status: DC

## 2019-07-25 MED ORDER — LOPERAMIDE HCL 2 MG PO CAPS
2.0000 mg | ORAL_CAPSULE | ORAL | Status: DC | PRN
  Administered 2019-07-25: 2 mg via ORAL
  Filled 2019-07-25: qty 1

## 2019-07-25 MED ORDER — AMLODIPINE BESYLATE 10 MG PO TABS: 5.0000 mg | ORAL_TABLET | Freq: Every day | ORAL | 0 refills | Status: AC

## 2019-07-25 MED ORDER — RENA-VITE PO TABS: 1.0000 | ORAL_TABLET | Freq: Every day | ORAL | 0 refills | Status: AC

## 2019-07-25 NOTE — Progress Notes (Signed)
Provided discharge education/instructions, all questions and concerns addressed, Pt not in distress, to discharge home with belongings accompanied by daughter and son.

## 2019-07-25 NOTE — Progress Notes (Signed)
Renal Navigator received a call from [employer] stating she has not been able to reach patient's daughter, who has asked to be the contact for OP HD payment arrangements. Navigator attempted to call daughter with no answer also. Navigator went to patient's room and had patient call daughter who apparently was somewhere else in the hospital. She then presented to the room and stated that she has been getting calls she hasn't answered. Navigator explained that in order to move forward with the OP HD plans, it is imperative that she answer her phone because the Financial Coordinator is attempting to call. Navigator asked patient's daughter to call Coordinator back while Navigator was in the room. Patient now has a flight booked out of [place] for July 19th, 2021. Flight confirmation sent to Coordinator as end date is needed.  Before leaving the room, patient states that she has been having diarrhea this morning and is not feeling as well as she has been the past couple days. Navigator asked if she has informed her RN or MDs. She states she told her RN. Navigator strongly encourages one more HD treatment before leaving the hospital, hoping that OP HD will be arranged to start on Monday. Patient states she agrees to inpatient HD in the morning and hopes to discharge afterwards.  Once out of pocket payment for treatment has been paid up front and contract signed, which has been sent by Coordinator to daughter, Navigator will provide OP HD seat time to patient and family. Navigator has confirmed seat with [facility] Manager/A. [place]. Renal Navigator has kept [place] up to date and she remains willing to accept patient for the 6 needed OP HD treatments until she returns to [place].  Navigator received call from patient's daughter around 2:20pm stating that as long as OP HD is arranged today, her mother wants to discharge and is now not in agreement for inpt HD here  before discharge tomorrow. Navigator advises against this, but ultimately this decision is up to patient. Navigator will update MDs. Navigator has received confirmation from Museum/gallery curator as of 3:15pm that patient's daughter has Teacher, adult education and provided payment for treatment and Navigator will release seat information. Patient has a 7:30am seat at [facility] starting Monday, 07/29/19. She needs to arrive at 7:00am on her first day of treatment.  Alphonzo Cruise, [place] Renal Navigator [phone]

## 2019-07-25 NOTE — Discharge Summary (Addendum)
PATIENT DETAILS Name: Crystal Jacobs Age: 59 y.o. Sex: female Date of Birth: 1960/10/12 MRN: [id number]. Admitting Physician: Lenore Cordia, MD NVB:TYOMAYO, No Pcp Per  Admit Date: 07/13/2019 Discharge date: 07/25/2019  Recommendations for Outpatient Follow-up:  1. Follow up with PCP in 1-2 weeks 2. Please obtain CMP/CBC in one week  Admitted From:  Home  Disposition: [place]: No  Equipment/Devices: None  Discharge Condition: Stable  CODE STATUS: FULL CODE  Diet recommendation:  Diet Order            Diet - low sodium heart healthy           Diet Carb Modified           Diet renal with fluid restriction Fluid restriction: 2000 mL Fluid; Room service appropriate? Yes; Fluid consistency: Thin  Diet effective now                  Brief Narrative: Patient is a 59 y.o. female with tree of IDDM, HTN, CKD stage IV, claims to have unilateral kidney-from [place]-here visiting family- presented to the hospital with weakness-she was found to have a sodium of 106, creatinine of 6.5-subsequently admitted to the hospitalist service.  Hospital course complicated by development of acute encephalopathy, poor appetite, asterixis-in spite of correction of hyponatremia-subsequently thought to be secondary to uremia-subsequently started on ESRD.  Significant events: 6/19>> admit to [facility] for generalized weakness found to have severe hyponatremia-sodium of 106 6/24>> started on HD  Significant studies: 6/19>> chest x-ray: No active disease 6/23>> CT head: Chronic small vessel disease without acute intracranial abnormality. 6/23>> x-ray abdomen: No acute abnormality noted.  Antimicrobial therapy: Rocephin: 6/25>> 6/27  Microbiology data: 6/24 >>urine culture:<10,000 colonies/mL insignificant growth 6/23>> blood culture: Negative  Procedures : None  Consults: Nephrology  Brief Hospital Course: Hyponatremia: Sodium of 106 on admission-thought to be  secondary to hypovolemia in the setting of nausea/vomiting diarrhea and orthostatic symptoms.  Treated with IV fluids-hyponatremia has resolved.  CKD stage IV-now with progression to ESRD: Has known CKD stage IV-and had a fistula placed earlier this year in [place]-in spite of correction of hyponatremia-patient apparently continued to have confusion, poor appetite and asterixis-subsequently started on HD with marked improvement.  She is now ESRD-needs hemodialysis.  Over the past few days-patient has refused HD-yesterday when nephrology/myself/SW had a family conference-family did not want her to have HD until [place] then today they have changed her mind-patient is requesting HD tomorrow.    However later this afternoon-when hearing that patient has a outpatient HD slot this coming Monday-patient and daughter are insisting on leaving the hospital today, both of them are aware of the life-threatening and life disabling risk of under dialysis (see prior).  They are being discharged at their own request.  Attending nephrologist Dr. Royce Macadamia informed.  Acute metabolic encephalopathy: Likely secondary to [facility] post HD.  Chronic hepatitis B: Evaluated by ID-family prefers that treatment of chronic hepatitis B deferred when she returns to [place].  IDDM (A1c 6.1 07/14/2019): Continues to have hypoglycemic episodes-Lantus has been discontinued-continue with NovoLog premeal.  Patient and daughter both claim that they are very well aware of the symptoms of hypoglycemia-patient has had longstanding diabetes-end-stage very confident of managing her sugars at home-family/patient is aware of the needed interventions for hypoglycemia.  Per patient-she is very well familiar with sliding scale regimen that she has been on for the past several years.  HTN: BP stable-continue amlodipine and metoprolol.   (Note-discussed with daughter-she  is agreeable to discontinue prazosin and long-acting metoprolol-and to  continue with amlodipine and short-acting metoprolol that she is on during this hospital stay)  Thrombocytopenia: Improved-HIT panel negative.  Follow closely in the outpatient setting  Anemia: Secondary to CKD-hemoglobin trending down-nephrology following and managing care.  Asymptomatic-no indication for transfusion at this point.   Follow CBC in the outpatient setting.  Disposition issues: Difficult situation-patient wants to go back to [place]-and discuss further HD with her primary nephrologist-however due to logistical issues/family issues etc she is not able to go back to [place] right away.Please see prior notes-extensive discussions over the past several days by myself-prior hospitalist MD with patient/family members at bedside-in spite of [place] explaining need for regular dialysis (has refused HD treatments while inpatient), arrangements ongoing for outpatient HD etc.-family has had multiple issues/complaints which we have addressed multiple times.  Unfortunately family keeps on asking these questions almost on a daily basis.  Clinical social work followed closely-subsequent following and attempting to arrange for outpatient HD care (see prior documentation by her)  Nutrition Problem: Nutrition Problem: Inadequate oral intake Etiology: poor appetite Signs/Symptoms: per patient/family report Interventions: MVI, Magic cup, Nepro shake  Discharge Diagnoses:  Active Problems:   Hyponatremia   CKD (chronic kidney disease), stage V (HCC)   Microcytic anemia   Thrombocytopenia (HCC)   Insulin dependent type 2 diabetes mellitus (HCC)   Hypochloremia   Hypertension   Chronic hepatitis B (HCC)   ESRD (end stage renal disease) (HCC)   Goals of care, counseling/discussion   Palliative care by specialist   Discharge Instructions:  Activity:  As tolerated with Full fall precautions use walker/cane & assistance as needed  Discharge Instructions    Call MD for:  difficulty breathing,  headache or visual disturbances   Complete by: As directed    Call MD for:  extreme fatigue   Complete by: As directed    Call MD for:  persistant dizziness or light-headedness   Complete by: As directed    Call MD for:  persistant nausea and vomiting   Complete by: As directed    Diet - low sodium heart healthy   Complete by: As directed    Diet Carb Modified   Complete by: As directed    Discharge instructions   Complete by: As directed    Follow with Primary MD in 1-2 weeks  Please follow at the hemodialysis center as instructed.  Please get a complete blood count and chemistry panel checked by your Primary MD at your next visit, and again as instructed by your Primary MD.  Get Medicines reviewed and adjusted: Please take all your medications with you for your next visit with your Primary MD  Laboratory/radiological data: Please request your Primary MD to go over all hospital tests and procedure/radiological results at the follow up, please ask your Primary MD to get all Hospital records sent to his/her office.  In some cases, they will be blood work, cultures and biopsy results pending at the time of your discharge. Please request that your primary care M.D. follows up on these results.  Also Note the following: If you experience worsening of your admission symptoms, develop shortness of breath, life threatening emergency, suicidal or homicidal thoughts you must seek medical attention immediately by calling 911 or calling your MD immediately  if symptoms less severe.  You must read complete instructions/literature along with all the possible adverse reactions/side effects for all the Medicines you take and that have been prescribed to you.  Take any new Medicines after you have completely understood and accpet all the possible adverse reactions/side effects.   Do not drive when taking Pain medications or sleeping medications (Benzodaizepines)  Do not take more than prescribed  Pain, Sleep and Anxiety Medications. It is not advisable to combine anxiety,sleep and pain medications without talking with your primary care practitioner  Special Instructions: If you have smoked or chewed Tobacco  in the last 2 yrs please stop smoking, stop any regular Alcohol  and or any Recreational drug use.  Wear Seat belts while driving.  Please note: You were cared for by a hospitalist during your hospital stay. Once you are discharged, your primary care physician will handle any further medical issues. Please note that NO REFILLS for any discharge medications will be authorized once you are discharged, as it is imperative that you return to your primary care physician (or establish a relationship with a primary care physician if you do not have one) for your post hospital discharge needs so that they can reassess your need for medications and monitor your lab values.   Increase activity slowly   Complete by: As directed    No wound care   Complete by: As directed      Allergies as of 07/25/2019      Reactions   Heparin    Dr. Sherral Hammers ordered Heparin as a allergy for this patient      Medication List    STOP taking these medications   insulin glargine 100 UNIT/ML injection Commonly known as: LANTUS   metoprolol succinate 50 MG 24 hr tablet Commonly known as: TOPROL-XL   MULTIVITAMIN ADULT PO   prazosin 5 MG capsule Commonly known as: MINIPRESS   SODIUM BICARBONATE PO     TAKE these medications   amLODipine 10 MG tablet Commonly known as: NORVASC Take 0.5 tablets (5 mg total) by mouth daily. Start taking on: July 26, 2019   ASPIRIN EC PO Take 100 mg by mouth daily at 8 pm. Patient takes a combination pill - Clopidogrel/Acetylsalicylic acid 75 mg/100mg    cholecalciferol 25 MCG (1000 UNIT) tablet Commonly known as: VITAMIN D3 Take 1,000 Units by mouth daily at 2 PM.   clopidogrel 75 MG tablet Commonly known as: PLAVIX Take 75 mg by mouth daily at 8 pm. Patient  takes a combination pill - Clopidogrel/Acetylsalicylic acid 75 mg/100mg    febuxostat 40 MG tablet Commonly known as: ULORIC Take 40 mg by mouth daily at 2 PM.   feeding supplement (NEPRO CARB STEADY) Liqd Take 237 mLs by mouth 2 (two) times daily between meals. Start taking on: July 26, 2019   insulin aspart 100 UNIT/ML injection Commonly known as: novoLOG Inject 2-6 Units into the skin 3 (three) times daily with meals. 2-6 units with each meal depending on blood sugar   loperamide 2 MG capsule Commonly known as: IMODIUM Take 1 capsule (2 mg total) by mouth as needed for diarrhea or loose stools.   metoprolol tartrate 25 MG tablet Commonly known as: LOPRESSOR Take 0.5 tablets (12.5 mg total) by mouth 2 (two) times daily.   multivitamin Tabs tablet Take 1 tablet by mouth at bedtime.   pantoprazole 40 MG tablet Commonly known as: PROTONIX Take 40 mg by mouth daily.   PROBIOTIC PO Take 1 capsule by mouth 2 (two) times daily. Lobun Forte - streptococcus thermophilus   rosuvastatin 10 MG tablet Commonly known as: CRESTOR Take 10 mg by mouth daily.       Follow-up  Information     [facility]. Go to.   Why: Your appointment is scheduled July 15 at 2 pm for a hospital follow up and primary care physician contact. Contact information: [place] [id number] [place], [facility] Follow up.   Specialty: Home Health Services Why: You should receive a call from [facility] regarding physical therapy. Contact information: [place] [address] [phone]              Allergies  Allergen Reactions  . Heparin     Dr. Sherral Hammers ordered Heparin as a allergy for this patient    Other Procedures/Studies: CT HEAD WO CONTRAST  Result Date: 07/17/2019 CLINICAL DATA:  Generalized weakness. EXAM: CT HEAD WITHOUT CONTRAST TECHNIQUE: Contiguous axial images  were obtained from the base of the skull through the vertex without intravenous contrast. COMPARISON:  None. FINDINGS: Brain: There is no mass, hemorrhage or extra-axial collection. The size and configuration of the ventricles and extra-axial CSF spaces are normal. There is hypoattenuation of the white matter, most commonly indicating chronic small vessel disease. Vascular: No abnormal hyperdensity of the major intracranial arteries or dural venous sinuses. No intracranial atherosclerosis. Skull: The visualized skull base, calvarium and extracranial soft tissues are normal. Sinuses/Orbits: No fluid levels or advanced mucosal thickening of the visualized paranasal sinuses. No mastoid or middle ear effusion. The orbits are normal. IMPRESSION: Chronic small vessel disease without acute intracranial abnormality. Electronically Signed   By: Ulyses Jarred M.D.   On: 07/17/2019 20:22   DG Chest Portable 1 View  Result Date: 07/13/2019 CLINICAL DATA:  59 year old female with chest pain. EXAM: PORTABLE CHEST 1 VIEW COMPARISON:  None. FINDINGS: The heart size and mediastinal contours are within normal limits. Both lungs are clear. The visualized skeletal structures are unremarkable. IMPRESSION: No active disease. Electronically Signed   By: Anner Crete M.D.   On: 07/13/2019 20:38   DG Abd Portable 1V  Result Date: 07/17/2019 CLINICAL DATA:  Evaluate for possible constipation EXAM: PORTABLE ABDOMEN - 1 VIEW COMPARISON:  None. FINDINGS: Scattered large and small bowel gas is noted. No significant retained fecal material is noted to suggest constipation. No free air is noted. No mass lesion is seen. Bony structures appear within normal limits. IMPRESSION: No acute abnormality noted. Electronically Signed   By: Inez Catalina M.D.   On: 07/17/2019 16:58      TODAY-DAY OF DISCHARGE:  Subjective:   [name] today has no headache,no chest abdominal pain,no new weakness tingling or numbness, feels much  better wants to go home today.   Objective:   Blood pressure (!) 130/50, pulse 77, temperature 98.2 F (36.8 C), temperature source Oral, resp. rate 15, height 5\' 2"  (1.575 m), weight 45.1 kg, SpO2 100 %.  Intake/Output Summary (Last 24 hours) at 07/25/2019 1606 Last data filed at 07/25/2019 0900 Gross per 24 hour  Intake 440 ml  Output --  Net 440 ml   Filed Weights   07/24/19 0147 07/24/19 0507 07/25/19 0435  Weight: 47.1 kg 47.1 kg 45.1 kg    Exam: Awake Alert, Oriented *3, No new F.N deficits, Normal affect [place].AT,PERRAL Supple Neck,No JVD, No cervical lymphadenopathy appriciated.  Symmetrical Chest wall movement, Good air movement bilaterally, CTAB RRR,No Gallops,Rubs or new Murmurs, No Parasternal Heave +ve B.Sounds, Abd Soft, Non tender, No organomegaly appriciated, No rebound -guarding or rigidity. No Cyanosis, Clubbing or  edema, No new Rash or bruise   PERTINENT RADIOLOGIC STUDIES: No results found.   PERTINENT LAB RESULTS: CBC: Recent Labs    07/23/19 0707 07/24/19 0820  WBC 7.4 7.4  HGB 7.5* 8.7*  HCT 23.7* 27.9*  PLT 92* 115*   CMET CMP     Component Value Date/Time   NA 133 (L) 07/25/2019 0508   K 4.0 07/25/2019 0508   CL 98 07/25/2019 0508   CO2 29 07/25/2019 0508   GLUCOSE 106 (H) 07/25/2019 0508   BUN 10 07/25/2019 0508   CREATININE 2.36 (H) 07/25/2019 0508   CALCIUM 8.0 (L) 07/25/2019 0508   PROT 5.2 (L) 07/24/2019 0820   ALBUMIN 2.7 (L) 07/25/2019 0508   AST 47 (H) 07/24/2019 0820   ALT 37 07/24/2019 0820   ALKPHOS 47 07/24/2019 0820   BILITOT 0.8 07/24/2019 0820   GFRNONAA 22 (L) 07/25/2019 0508   GFRAA 26 (L) 07/25/2019 0508    GFR Estimated Creatinine Clearance: 19 mL/min (A) (by C-G formula based on SCr of 2.36 mg/dL (H)). No results for input(s): LIPASE, AMYLASE in the last 72 hours. No results for input(s): CKTOTAL, CKMB, CKMBINDEX, TROPONINI in the last 72 hours. Invalid input(s): POCBNP No results for input(s): DDIMER in the  last 72 hours. No results for input(s): HGBA1C in the last 72 hours. No results for input(s): CHOL, HDL, LDLCALC, TRIG, CHOLHDL, LDLDIRECT in the last 72 hours. No results for input(s): TSH, T4TOTAL, T3FREE, THYROIDAB in the last 72 hours.  Invalid input(s): FREET3 No results for input(s): VITAMINB12, FOLATE, FERRITIN, TIBC, IRON, RETICCTPCT in the last 72 hours. Coags: No results for input(s): INR in the last 72 hours.  Invalid input(s): PT Microbiology: Recent Results (from the past 240 hour(s))  Culture, blood (routine x 2)     Status: None   Collection Time: 07/17/19  5:31 PM   Specimen: BLOOD  Result Value Ref Range Status   Specimen Description BLOOD RIGHT ANTECUBITAL  Final   Special Requests   Final    BOTTLES DRAWN AEROBIC ONLY Blood Culture adequate volume   Culture   Final    NO GROWTH 5 DAYS Performed at [facility], [address] [place], [place] [address]    Report Status 07/22/2019 FINAL  Final  Culture, blood (routine x 2)     Status: None   Collection Time: 07/17/19  5:38 PM   Specimen: BLOOD LEFT WRIST  Result Value Ref Range Status   Specimen Description BLOOD LEFT WRIST  Final   Special Requests   Final    BOTTLES DRAWN AEROBIC ONLY Blood Culture adequate volume   Culture   Final    NO GROWTH 5 DAYS Performed at [facility], [place] [address] [place], [place] [address]    Report Status 07/22/2019 FINAL  Final  Culture, Urine     Status: Abnormal   Collection Time: 07/18/19  6:20 PM   Specimen: Urine, Random  Result Value Ref Range Status   Specimen Description URINE, RANDOM  Final   Special Requests NONE  Final   Culture (A)  Final    <10,000 COLONIES/mL INSIGNIFICANT GROWTH Performed at [facility], [place] [address], [place], [place] [address]    Report Status 07/20/2019 FINAL  Final    FURTHER DISCHARGE INSTRUCTIONS:  Get Medicines reviewed and adjusted: Please take all your medications with you for your next visit with  your Primary MD  Laboratory/radiological data: Please request your Primary MD to  go over all hospital tests and procedure/radiological results at the follow up, please ask your Primary MD to get all Hospital records sent to his/her office.  In some cases, they will be blood work, cultures and biopsy results pending at the time of your discharge. Please request that your primary care M.D. goes through all the records of your hospital data and follows up on these results.  Also Note the following: If you experience worsening of your admission symptoms, develop shortness of breath, life threatening emergency, suicidal or homicidal thoughts you must seek medical attention immediately by calling 911 or calling your MD immediately  if symptoms less severe.  You must read complete instructions/literature along with all the possible adverse reactions/side effects for all the Medicines you take and that have been prescribed to you. Take any new Medicines after you have completely understood and accpet all the possible adverse reactions/side effects.   Do not drive when taking Pain medications or sleeping medications (Benzodaizepines)  Do not take more than prescribed Pain, Sleep and Anxiety Medications. It is not advisable to combine anxiety,sleep and pain medications without talking with your primary care practitioner  Special Instructions: If you have smoked or chewed Tobacco  in the last 2 yrs please stop smoking, stop any regular Alcohol  and or any Recreational drug use.  Wear Seat belts while driving.  Please note: You were cared for by a hospitalist during your hospital stay. Once you are discharged, your primary care physician will handle any further medical issues. Please note that NO REFILLS for any discharge medications will be authorized once you are discharged, as it is imperative that you return to your primary care physician (or establish a relationship with a primary care physician if you  do not have one) for your post hospital discharge needs so that they can reassess your need for medications and monitor your lab values.  Total Time spent coordinating discharge including counseling, education and face to face time equals 35 minutes.  SignedOren Binet 07/25/2019 4:06 PM

## 2019-07-25 NOTE — Plan of Care (Signed)
Per HD SW patient is requesting HD here again tomorrow to optimize.  She and her daughter both expressed to SW that they would like her to have dialysis tomorrow   Claudia Desanctis, MD  12:59 PM 07/25/2019

## 2019-07-25 NOTE — Progress Notes (Signed)
Renal Navigator and Attending met with patient and daughter at bedside. OP HD seat time and clinic information given verbally and in writing to patient and daughter.  °Patient states to MD and Navigator that she wants to discharge today, accepting the risks outlined by MD of leaving without having another recommended HD treatment. Dr. Foster updated.  °Patient is cleared for discharge from an OP HD standpoint. ° °,  Elizabeth, LCSW °Renal Navigator °[phone]  °

## 2019-07-25 NOTE — Progress Notes (Signed)
PROGRESS NOTE        PATIENT DETAILS Name: Crystal Jacobs Age: 59 y.o. Sex: female Date of Birth: [birth year] Admit Date: 07/13/2019 Admitting Physician Lenore Cordia, MD [id number], No Pcp Per  Brief Narrative: Patient is a 59 y.o. female with tree of IDDM, HTN, CKD stage IV, claims to have unilateral kidney-from [place]-here visiting family- presented to the hospital with weakness-she was found to have a sodium of 1o6, creatinine of 6.5-subsequently admitted to the hospitalist service.  Hospital course complicated by development of acute encephalopathy, poor appetite, asterixis-in spite of correction of hyponatremia-subsequently thought to be secondary to uremia-subsequently started on ESRD.  Significant events: 6/19>> admit to [facility] for generalized weakness found to have severe hyponatremia-sodium of 106 6/24>> started on HD  Significant studies: 6/19>> chest x-ray: No active disease 6/23>> CT head: Chronic small vessel disease without acute intracranial abnormality. 6/23>> x-ray abdomen: No acute abnormality noted.  Antimicrobial therapy: Rocephin: 6/25>> 6/27  Microbiology data: 6/24 >>urine culture:<10,000 colonies/mL insignificant growth 6/23>> blood culture: Negative  Procedures : None  Consults: Nephrology  DVT Prophylaxis : Place and maintain sequential compression device Start: 07/17/19 2017  Subjective: Did not have any major complaints to me-but later on the nurse called me saying she had a few loose stools.  She was lying in bed comfortably-no chest pain or shortness of breath when I saw her earlier this morning.  Assessment/Plan: Hyponatremia: Sodium of 106 on admission-thought to be secondary to hypovolemia in the setting of nausea/vomiting diarrhea and orthostatic symptoms.  Treated with IV fluids-hyponatremia has resolved.  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  She is now ESRD-needs hemodialysis.  Over the past few days-patient has refused HD-yesterday when nephrology/myself/SW had a family conference-family did not want her to have HD until [place] today they have changed her mind-patient is requesting HD tomorrow.  Clinical social worker-in the process of arranging for outpatient HD.  Acute metabolic encephalopathy: Likely secondary to [facility] post HD.  Chronic hepatitis B: Evaluated by ID-family prefers that treatment of chronic hepatitis B deferred when she returns to [place].  IDDM (A1c 6.1 07/14/2019): Continues to have hypoglycemic episodes-continue to hold Lantus-continue sensitive SSI.  Follow.    Recent Labs    07/25/19 0735 07/25/19 0842 07/25/19 1145  GLUCAP 48* 104* 73    HTN: BP stable-continue amlodipine and metoprolol.  Thrombocytopenia: Improved-HIT panel negative.  Anemia: Secondary to CKD-hemoglobin trending down-nephrology following and managing care.  Asymptomatic-no indication for transfusion at this point.   Disposition issues: Difficult situation-patient wants to go back to [place]-and discuss further HD with her primary nephrologist-however due to logistical issues/family issues etc she is not able to go back to [place] right away.Please see prior notes-extensive discussions over the past several days by myself-prior hospitalist MD with patient/family members at bedside-in spite of [place] explaining need for regular dialysis, arrangements ongoing for outpatient HD etc.-family has had multiple issues/complaints which we have addressed multiple times.  Unfortunately family keeps on asking these questions almost on a daily basis.  Clinical social work following and attempting to arrange for outpatient HD care (see prior documentation by her)   Nutrition  Problem:  Nutrition  Problem: Inadequate oral intake Etiology: poor appetite Signs/Symptoms: per patient/family report Interventions: MVI, Magic cup, Nepro shake   Diet: Diet Order            Diet renal with fluid restriction Fluid restriction: 2000 mL Fluid; Room service appropriate? Yes; Fluid consistency: Thin  Diet effective now                  Code Status: Full code  Family Communication: No family member at bedside-at patient's request-called patient's son Hope Pigeon updated him.  He is aware that clinical social worker is in the process of arranging outpatient HD care-this process usually takes several days.  Disposition Plan: Home vs Home with Home health vs SNF when ready for discharge Status is: Inpatient  Remains inpatient appropriate because:Inpatient level of care appropriate due to severity of illness  Dispo: The patient is from: Home              Anticipated d/c is to: Home              Anticipated d/c date is: 1 day              Patient currently is medically stable to d/c.   Barriers to Discharge: Needs outpatient hemodialysis arrangement  Antimicrobial agents: Anti-infectives (From admission, onward)   Start     Dose/Rate Route Frequency Ordered Stop   07/18/19 1000  cefTRIAXone (ROCEPHIN) 1 g in sodium chloride 0.9 % 100 mL IVPB  Status:  Discontinued        1 g 200 mL/hr over 30 Minutes Intravenous Every 24 hours 07/18/19 0905 07/20/19 1230       Time spent: 25- minutes-Greater than 50% of this time was spent in counseling, explanation of diagnosis, planning of further management, and coordination of care.  MEDICATIONS: Scheduled Meds: . amLODipine  10 mg Oral Daily  . aspirin EC  81 mg Oral Daily  . [START ON 07/26/2019] Chlorhexidine Gluconate Cloth  6 each Topical Q0600  . cholecalciferol  1,000 Units Oral Q1400  . clopidogrel  75 mg Oral Daily  . darbepoetin (ARANESP) injection - NON-DIALYSIS  100 mcg Subcutaneous Q Thu-1800  .  febuxostat  40 mg Oral Q1400  . feeding supplement (NEPRO CARB STEADY)  237 mL Oral BID BM  . insulin aspart  0-6 Units Subcutaneous TID WC  . metoprolol tartrate  12.5 mg Oral BID  . multivitamin  1 tablet Oral QHS  . sodium chloride flush  3 mL Intravenous Q12H  . sodium chloride flush  3 mL Intravenous Q12H   Continuous Infusions: . sodium chloride 250 mL (07/19/19 1721)  . sodium chloride    . sodium chloride     PRN Meds:.sodium chloride, sodium chloride, sodium chloride, acetaminophen **OR** acetaminophen, haloperidol lactate, hydrALAZINE, lidocaine (PF), lidocaine-prilocaine, loperamide, LORazepam, ondansetron **OR** ondansetron (ZOFRAN) IV, pentafluoroprop-tetrafluoroeth, senna, sodium chloride flush   PHYSICAL EXAM: Vital signs: Vitals:   07/24/19 1500 07/24/19 2001 07/25/19 0435 07/25/19 0848  BP: (!) 132/52 (!) 144/60 125/61 (!) 130/50  Pulse: 75 80 79 77  Resp: 16 16 16 15   Temp: 98.2 F (36.8 C) 98.5 F (36.9 C) 98.3 F (36.8 C) 98.2 F (36.8 C)  TempSrc: Oral Oral Oral Oral  SpO2: 100% 96% 99% 100%  Weight:   45.1 kg   Height:       Filed Weights   07/24/19 0147 07/24/19 0507 07/25/19 0435  Weight: 47.1 kg 47.1 kg 45.1 kg   Body mass index is  18.19 kg/m.   Gen Exam:Alert awake-not in any distress HEENT:atraumatic, normocephalic Chest: B/L clear to auscultation anteriorly CVS:S1S2 regular Abdomen:soft non tender, non distended Extremities:no edema Neurology: Non focal Skin: no rash  I have personally reviewed following labs and imaging studies  LABORATORY DATA: CBC: Recent Labs  Lab 07/20/19 0408 07/21/19 0441 07/22/19 0928 07/23/19 0707 07/24/19 0820  WBC 6.3 6.4 8.6 7.4 7.4  NEUTROABS 4.8 4.8 7.1 5.8 5.8  HGB 8.0* 8.4* 9.0* 7.5* 8.7*  HCT 25.5* 26.6* 28.9* 23.7* 27.9*  MCV 63.0* 63.9* 64.1* 64.6* 65.0*  PLT 73* 77* 101* 92* 115*    Basic Metabolic Panel: Recent Labs  Lab 07/20/19 0408 07/20/19 0938 07/21/19 0441 07/22/19 0928  07/23/19 0707 07/24/19 0820 07/25/19 0508  NA   < >  --  135 133* 133* 138 133*  K   < >  --  3.9 3.8 4.4 3.9 4.0  CL   < >  --  99 98 99 100 98  CO2   < >  --  30 28 24 30 29   GLUCOSE   < >  --  135* 127* 83 67* 106*  BUN   < >  --  5* 15 19 8 10   CREATININE   < >  --  1.73* 2.68* 3.26* 1.70* 2.36*  CALCIUM   < >  --  8.7* 8.9 8.3* 8.2* 8.0*  MG  --  1.7 2.3 2.4 2.3 2.0  --   PHOS   < >  --  1.7* 3.8 4.2 2.1* 2.7   < > = values in this interval not displayed.    GFR: Estimated Creatinine Clearance: 19 mL/min (A) (by C-G formula based on SCr of 2.36 mg/dL (H)).  Liver Function Tests: Recent Labs  Lab 07/19/19 0401 07/19/19 0401 07/20/19 0408 07/21/19 0441 07/22/19 0928 07/24/19 0820 07/25/19 0508  AST 24  --  26 25 30  47*  --   ALT 21  --  21 22 25  37  --   ALKPHOS 45  --  47 43 60 47  --   BILITOT 0.7  --  0.7 0.8 1.1 0.8  --   PROT 5.1*  --  4.8* 5.0* 5.4* 5.2*  --   ALBUMIN 2.7*   < > 2.4* 2.5* 2.8* 2.6* 2.7*   < > = values in this interval not displayed.   No results for input(s): LIPASE, AMYLASE in the last 168 hours. No results for input(s): AMMONIA in the last 168 hours.  Coagulation Profile: No results for input(s): INR, PROTIME in the last 168 hours.  Cardiac Enzymes: No results for input(s): CKTOTAL, CKMB, CKMBINDEX, TROPONINI in the last 168 hours.  BNP (last 3 results) No results for input(s): PROBNP in the last 8760 hours.  Lipid Profile: No results for input(s): CHOL, HDL, LDLCALC, TRIG, CHOLHDL, LDLDIRECT in the last 72 hours.  Thyroid Function Tests: No results for input(s): TSH, T4TOTAL, FREET4, T3FREE, THYROIDAB in the last 72 hours.  Anemia Panel: No results for input(s): VITAMINB12, FOLATE, FERRITIN, TIBC, IRON, RETICCTPCT in the last 72 hours.  Urine analysis:    Component Value Date/Time   COLORURINE YELLOW 07/18/2019 1819   APPEARANCEUR CLEAR 07/18/2019 1819   LABSPEC 1.008 07/18/2019 1819   PHURINE 7.0 07/18/2019 1819    GLUCOSEU 150 (A) 07/18/2019 1819   HGBUR NEGATIVE 07/18/2019 1819   BILIRUBINUR NEGATIVE 07/18/2019 1819   KETONESUR NEGATIVE 07/18/2019 1819   PROTEINUR 100 (A) 07/18/2019 1819   NITRITE NEGATIVE  07/18/2019 1819   LEUKOCYTESUR NEGATIVE 07/18/2019 1819    Sepsis Labs: Lactic Acid, Venous No results found for: LATICACIDVEN  MICROBIOLOGY: Recent Results (from the past 240 hour(s))  Culture, blood (routine x 2)     Status: None   Collection Time: 07/17/19  5:31 PM   Specimen: BLOOD  Result Value Ref Range Status   Specimen Description BLOOD RIGHT ANTECUBITAL  Final   Special Requests   Final    BOTTLES DRAWN AEROBIC ONLY Blood Culture adequate volume   Culture   Final    NO GROWTH 5 DAYS Performed at [facility], [place] [address], [place], [place] [address]    Report Status 07/22/2019 FINAL  Final  Culture, blood (routine x 2)     Status: None   Collection Time: 07/17/19  5:38 PM   Specimen: BLOOD LEFT WRIST  Result Value Ref Range Status   Specimen Description BLOOD LEFT WRIST  Final   Special Requests   Final    BOTTLES DRAWN AEROBIC ONLY Blood Culture adequate volume   Culture   Final    NO GROWTH 5 DAYS Performed at [facility], [place] [address], [place], [place] [address]    Report Status 07/22/2019 FINAL  Final  Culture, Urine     Status: Abnormal   Collection Time: 07/18/19  6:20 PM   Specimen: Urine, Random  Result Value Ref Range Status   Specimen Description URINE, RANDOM  Final   Special Requests NONE  Final   Culture (A)  Final    <10,000 COLONIES/mL INSIGNIFICANT GROWTH Performed at [facility], [place] [address], [place], [place] [address]    Report Status 07/20/2019 FINAL  Final    RADIOLOGY STUDIES/RESULTS: No results found.   LOS: 11 days   Oren Binet, MD  [facility]    To contact the attending provider between 7A-7P or the covering provider during after hours 7P-7A, please log into the web site [email or web address] and  access using universal [place] password for that web site. If you do not have the password, please call the hospital operator.  07/25/2019, 2:36 PM

## 2019-07-25 NOTE — Plan of Care (Signed)
  Problem: Clinical Measurements: Goal: Ability to maintain clinical measurements within normal limits will improve Outcome: Progressing   Problem: Nutrition: Goal: Adequate nutrition will be maintained Outcome: Progressing   Problem: Coping: Goal: Level of anxiety will decrease Outcome: Progressing   Problem: Elimination: Goal: Will not experience complications related to bowel motility Outcome: Progressing   Problem: Safety: Goal: Ability to remain free from injury will improve Outcome: Progressing   Problem: Skin Integrity: Goal: Risk for impaired skin integrity will decrease Outcome: Progressing

## 2019-07-25 NOTE — Progress Notes (Signed)
Palliative:  Goals are clear. No further palliative needs. Will sign off.   Vinie Sill, NP Palliative Medicine Team Pager [phone] (Please see [email or web address] for schedule) Team Phone [phone]

## 2019-07-25 NOTE — Progress Notes (Signed)
Outpatient HD slot arranged for this coming [place] patient and daughter wanted to do hemodialysis inpatient tomorrow-but after hearing that outpatient HD slot has been arranged-they are both requesting discharge.  Explained that patient has had only 1 HD this week-patient claimed to me that her doctor in [place] told her that 1 in 10 days is sufficient.  I explained to her that there are risks that are life-threatening and life disabling with under dialysis-explained electrolyte abnormalities/fluid overloaded can cause significant issues including death.  Both patient and daughter understand and accepting the risks-and adamant in leaving the hospital today.    Please note-clinical social worker-Colleen show at bedside as well.  Note-when seen this afternoon-patient denies diarrhea-states she had a few loose stools this morning.  Claims that she had a "pill" this morning that took care of diarrhea.

## 2019-07-25 NOTE — Progress Notes (Signed)
PT Cancellation Note  Patient Details Name: Crystal Jacobs MRN: [id number] DOB: January 15, 1961   Cancelled Treatment:    Reason Eval/Treat Not Completed: Other (comment).  Pt declines therapy via daughter due to diarrhea, and daughter reports she will take pt for a walk later since pt is walking well.  Nursing will receive message.   Ramond Dial 07/25/2019, 2:18 PM   Mee Hives, PT MS Acute Rehab Dept. Number: [place] [facility] [place]

## 2019-07-25 NOTE — Progress Notes (Signed)
[facility] Progress Note    Assessment/ Plan:   1. CKD advanced which has progressed to ESRD. - had AVF placed in March.  Creat 6.5 > 5.5-- 6.2 here w/ IVF's prior to starting HD.  Discussions with pt and son.  HD #1 6/24, HD #2 6/25 HD #3 6/26.  Improvement in symptoms with HD.  Nephrology has had multiple long discussions with pt, son, dtr re: ESRD.  Dialysis is life- sustaining at this point and stopping or even skipping rx confers greater risk of MI, CVA, uremic pericarditis, sleepiness, lethargy, buildup of minerals and uremic toxins, uremic coma, and death.   1. No emergent indication for HD today 2. She states she will not accept HD again until 7/5 - discussed risks.  3. Appreciate assistance of SW for outpatient planning for prior to her return to [place]  2. Acute encephalopathy: Improving. CT head negative, ABG OK, ammonia OK, AXR without constipation, blood cultures neg.  UA on admission with bacteruria, empirically rx with Rocephin. Likely uremia.   3. Hyponatremia - Na+ 106 on admit 6/19 w/ orthostatic symptoms and recent N/V, diarrhea. improved   4. Twitching: multifactorial but likely mineral imbalances.    5. Chronic Hep B: S Ag positive, SAB, CAB, Hep B e, HBV PCR elevated - treatment per primary team   6. Hypovolemia -resolved  7. HyperK- resolved  8. IDDM - per primary team  9. Anemia CKD - Hemoglobin has trended down.  Continue ESA - increased dose to 100 mcg aranesp from 60 mcg for 7/1 onward.  Iron is replete    9.  Thrombocytopenia: HIT ab normal, still not doing heparin with dialysis    10. Dispo: Appreciate assistance of SW.  Patient was given an estimate for the cost of outpatient HD and had elected to return home to [place] early to get HD there.  Family gave a departure date for [place] of 7/19 and SW started back on search for outpatient HD unit.  SW is continuing to follow   Subjective:    Met with patient a second time yesterday afternoon as  I charted - she stated she didn't want HD until Monday, 7/5.  Family gave a departure date for [place] of 7/19 and SW started back on search for outpatient HD unit yesterday.  We have discussed that dialysis is three times a week and that missing or shortening treatments does increase the risk of death; patient and her family voice understanding.  She confirms today she doesn't want HD again until 7/5.  She asked that we call her son who asked if she could be discharged and I stated not without an accepting outpatient dialysis unit.  Case discussed with Dr. Sloan Leiter.  I asked her not to walk around as floor was wet as has just been mopped apparently.  Moved the caution sign inside room so that it is visible to her.    Review of systems:   Denies any n/v  Denies shortness of breath or chest pain    Objective:   BP (!) 130/50 (BP Location: Right Arm)   Pulse 77   Temp 98.2 F (36.8 C) (Oral)   Resp 15   Ht _0  (1.575 m)   Wt 45.1 kg   LMP  (LMP Unknown)   SpO2 100%   BMI 18.19 kg/m   Intake/Output Summary (Last 24 hours) at 07/25/2019 0905 Last data filed at 07/24/2019 1000 Gross per 24 hour  Intake 240 ml  Output --  Net 240 ml   Weight change: -2 kg  Physical Exam:   Gen: adult female in bed in NAD CVS: RRR Resp: basilar crackles but unlabored and on room air Abd: soft nt nd Ext: no LE edema appreciated ACCESS: LUE AVF + T/B NEURO: AAOx 3 ; provides hx and follows commands Psych - normal mood and affect   Imaging: No results found.  Labs: BMET Recent Labs  Lab 07/19/19 0401 07/20/19 0408 07/21/19 0441 07/22/19 0928 07/23/19 0707 07/24/19 0820 07/25/19 0508  NA 135 134* 135 133* 133* 138 133*  K 3.5 3.1* 3.9 3.8 4.4 3.9 4.0  CL 99 99 99 98 99 100 98  CO2 _0 GLUCOSE 77 98 135* 127* 83 67* 106*  BUN 18 9 5* _1 CREATININE 3.71* 2.56* 1.73* 2.68* 3.26* 1.70* 2.36*  CALCIUM 8.8* 8.3* 8.7* 8.9 8.3* 8.2* 8.0*  PHOS 3.5 2.4* 1.7* 3.8 4.2 2.1*  2.7   CBC Recent Labs  Lab 07/21/19 0441 07/22/19 0928 07/23/19 0707 07/24/19 0820  WBC 6.4 8.6 7.4 7.4  NEUTROABS 4.8 7.1 5.8 5.8  HGB 8.4* 9.0* 7.5* 8.7*  HCT 26.6* 28.9* 23.7* 27.9*  MCV 63.9* 64.1* 64.6* 65.0*  PLT 77* 101* 92* 115*    Medications:    . amLODipine  10 mg Oral Daily  . aspirin EC  81 mg Oral Daily  . Chlorhexidine Gluconate Cloth  6 each Topical Q0600  . cholecalciferol  1,000 Units Oral Q1400  . clopidogrel  75 mg Oral Daily  . darbepoetin (ARANESP) injection - NON-DIALYSIS  100 mcg Subcutaneous Q Thu-1800  . febuxostat  40 mg Oral Q1400  . feeding supplement (NEPRO CARB STEADY)  237 mL Oral BID BM  . insulin aspart  0-6 Units Subcutaneous TID WC  . metoprolol tartrate  12.5 mg Oral BID  . multivitamin  1 tablet Oral QHS  . sodium chloride flush  3 mL Intravenous Q12H  . sodium chloride flush  3 mL Intravenous Q12H      Claudia Desanctis, MD 07/25/2019 , 9:24 AM

## 2019-08-08 ENCOUNTER — Ambulatory Visit: Payer: PRIVATE HEALTH INSURANCE | Attending: Physician Assistant | Admitting: Physician Assistant

## 2021-10-07 IMAGING — CT CT HEAD W/O CM
4 series · 16 of 47 positions shown, 18 images · non-contrast
Comparison: None.

CLINICAL DATA: Generalized weakness.

EXAM:
CT HEAD WITHOUT CONTRAST
TECHNIQUE: Contiguous axial images were obtained from the base of the skull
through the vertex without intravenous contrast.

[Series 3: head wo · axial · 0.41mm/px · z∈[+1116,+1231]mm · 7 of 31 slices shown, 9 images]
[im 4/31  brain]
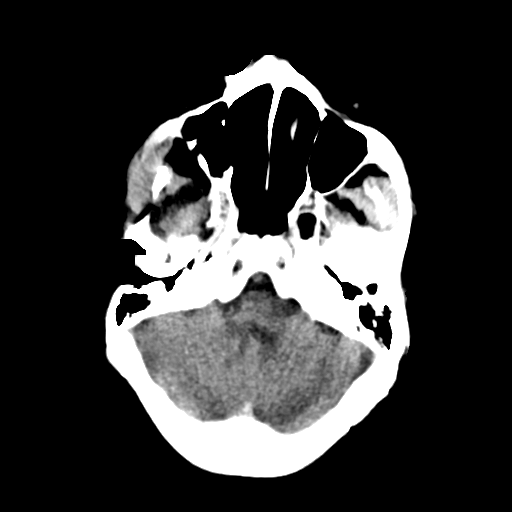
[im 4/31  bone]
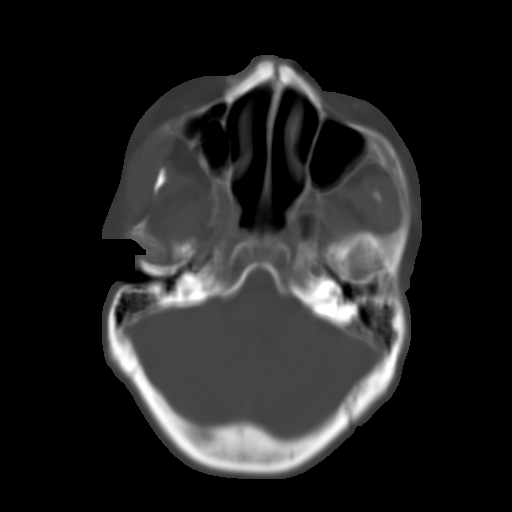
[im 8/31  brain]
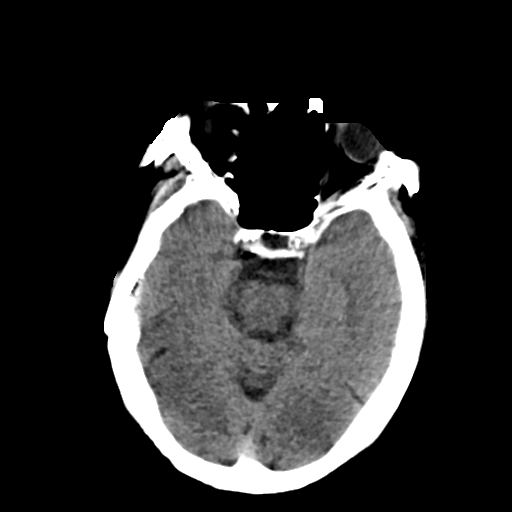
[im 12/31  brain]
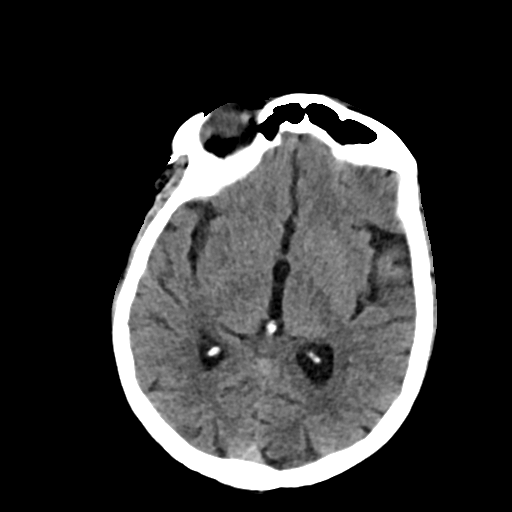
[im 16/31  brain]
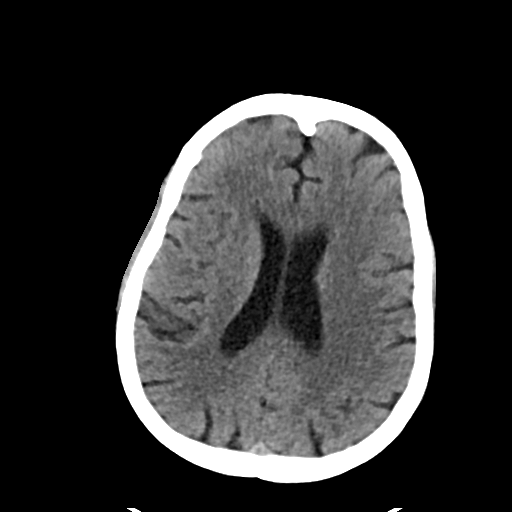
[im 19/31  brain]
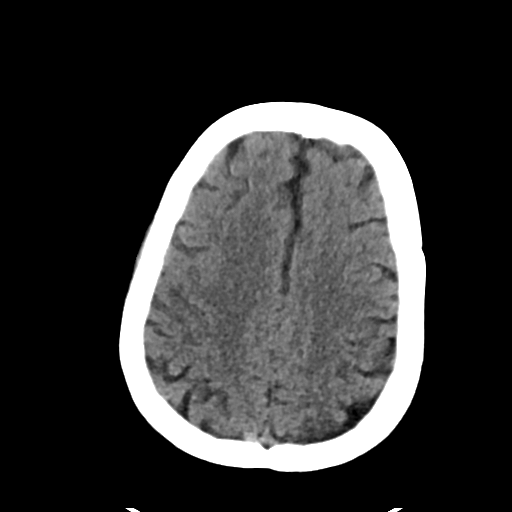
[im 19/31  bone]
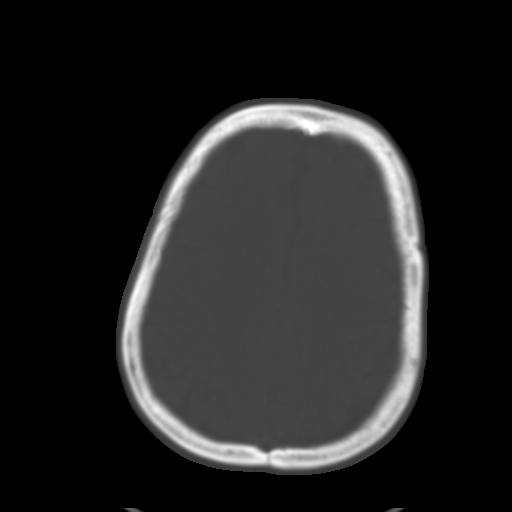
[im 23/31  brain]
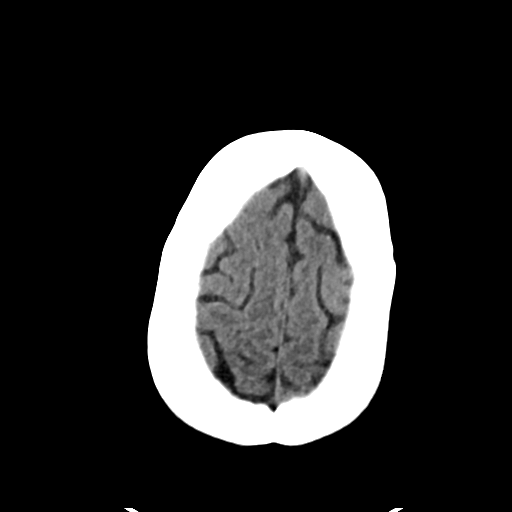
[im 27/31  brain]
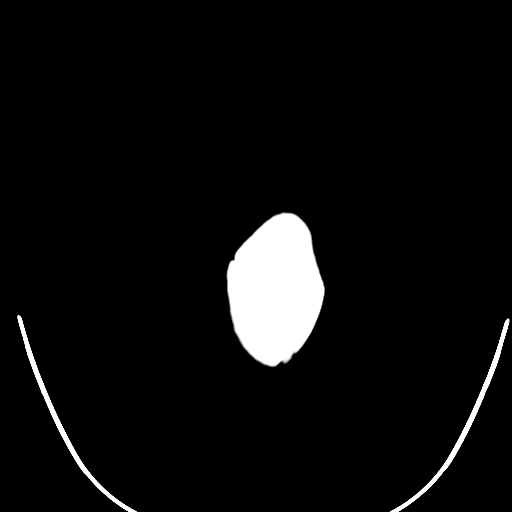

[Series 4: head bone · axial · 0.41mm/px · z∈[+1115,+1145]mm · 3 of 76 slices shown]
[im 8/76  bone]
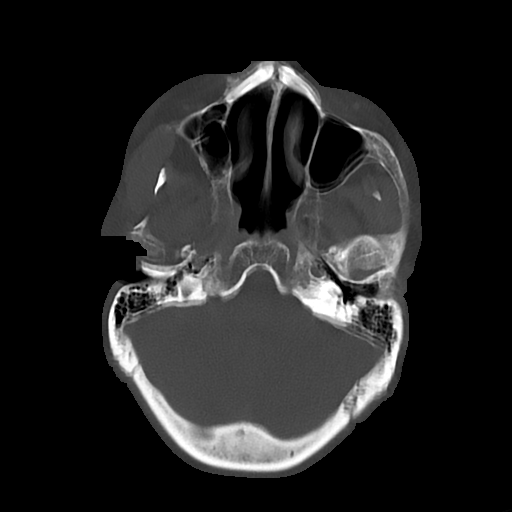
[im 16/76  bone]
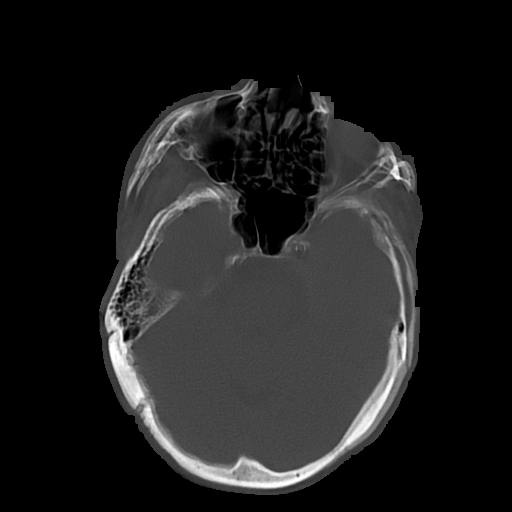
[im 23/76  bone]
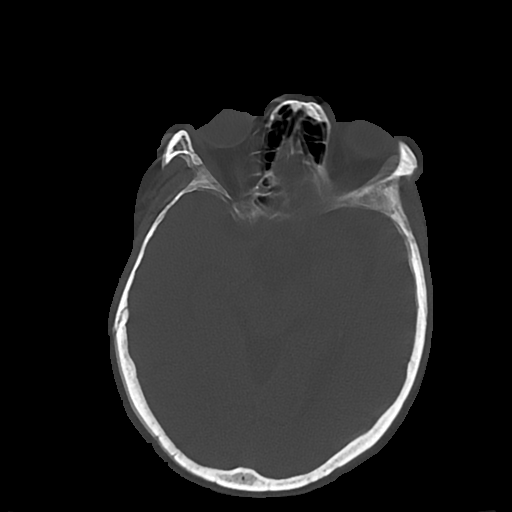

[Series 5: cor soft · coronal · 0.29mm/px · 3 of 66 slices shown]
[im 22/66  brain]
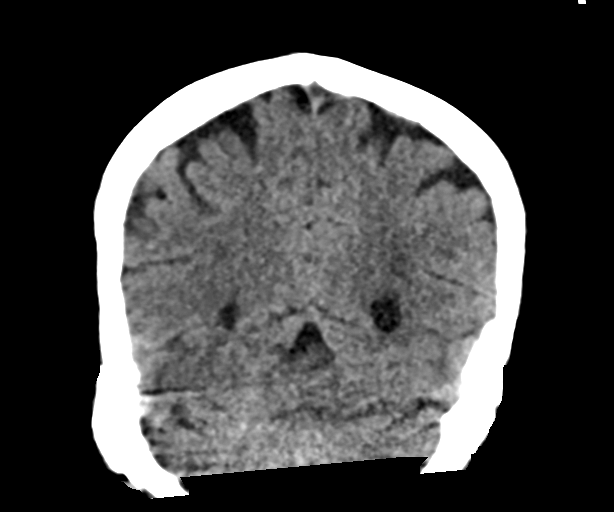
[im 29/66  brain]
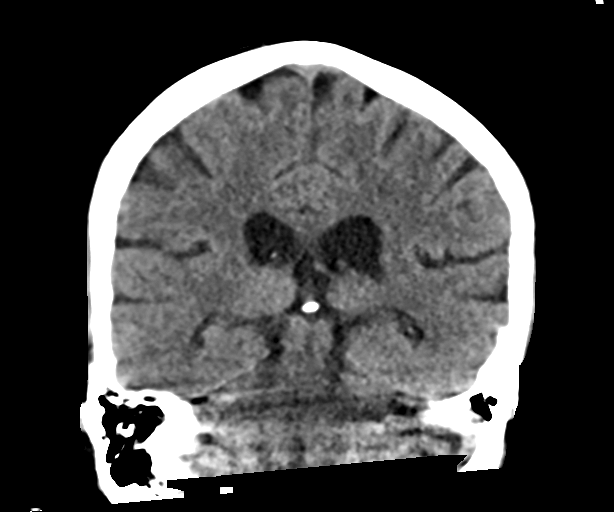
[im 37/66  brain]
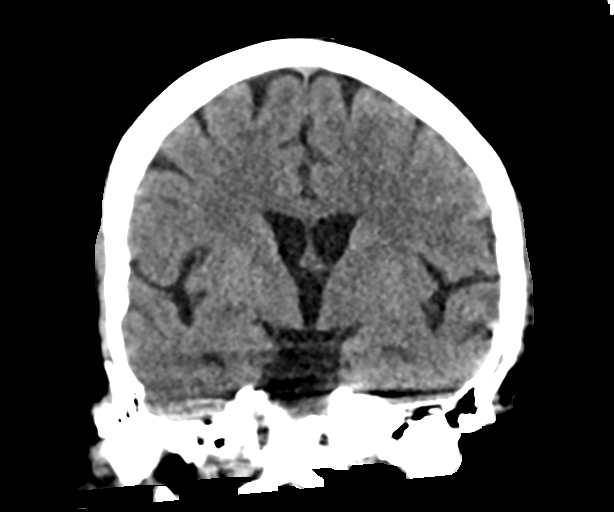

[Series 6: sag soft · sagittal · 0.30mm/px · 3 of 52 slices shown]
[im 18/52  brain]
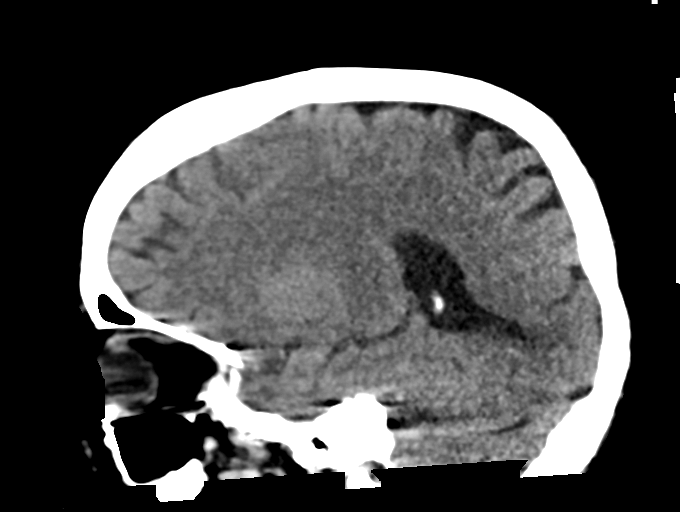
[im 26/52  brain]
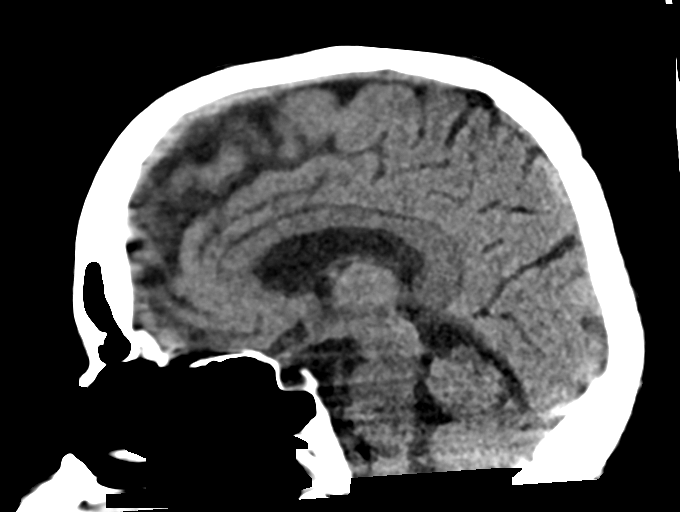
[im 35/52  brain]
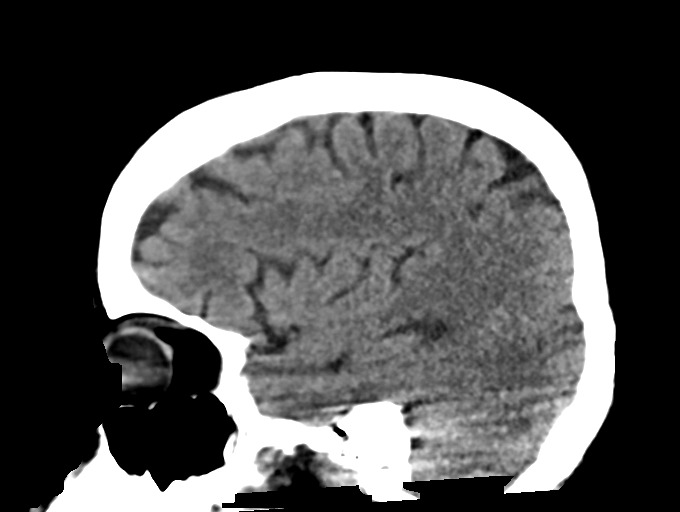

[16 of 47 positions shown; findings below may reference images not displayed]

FINDINGS: Brain: There is no mass, hemorrhage or extra-axial collection. The
size and configuration of the ventricles and extra-axial CSF spaces
are normal. There is hypoattenuation of the white matter, most
commonly indicating chronic small vessel disease.

Vascular: No abnormal hyperdensity of the major intracranial
arteries or dural venous sinuses. No intracranial atherosclerosis.

Skull: The visualized skull base, calvarium and extracranial soft
tissues are normal.

Sinuses/Orbits: No fluid levels or advanced mucosal thickening of
the visualized paranasal sinuses. No mastoid or middle ear effusion.
The orbits are normal.
IMPRESSION: Chronic small vessel disease without acute intracranial abnormality.
